# Patient Record
Sex: Male | Born: 2000 | Race: Black or African American | Hispanic: No | Marital: Single | Smoking: Never smoker
Health system: Southern US, Community
[De-identification: ages and names within clinical notes are randomized; demographics above are authoritative.]

## PROBLEM LIST (undated history)

## (undated) DIAGNOSIS — F913 Oppositional defiant disorder: Secondary | ICD-10-CM

## (undated) DIAGNOSIS — F988 Other specified behavioral and emotional disorders with onset usually occurring in childhood and adolescence: Secondary | ICD-10-CM

## (undated) DIAGNOSIS — G40909 Epilepsy, unspecified, not intractable, without status epilepticus: Secondary | ICD-10-CM

## (undated) DIAGNOSIS — T7840XA Allergy, unspecified, initial encounter: Secondary | ICD-10-CM

## (undated) DIAGNOSIS — K219 Gastro-esophageal reflux disease without esophagitis: Secondary | ICD-10-CM

## (undated) DIAGNOSIS — G43909 Migraine, unspecified, not intractable, without status migrainosus: Secondary | ICD-10-CM

## (undated) HISTORY — DX: Allergy, unspecified, initial encounter: T78.40XA

## (undated) HISTORY — PX: TYMPANOSTOMY TUBE PLACEMENT: SHX32

## (undated) HISTORY — DX: Other specified behavioral and emotional disorders with onset usually occurring in childhood and adolescence: F98.8

## (undated) HISTORY — DX: Epilepsy, unspecified, not intractable, without status epilepticus: G40.909

---
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
## 2020-01-17 DIAGNOSIS — H5213 Myopia, bilateral: Secondary | ICD-10-CM | POA: Diagnosis not present

## 2020-01-18 DIAGNOSIS — H5213 Myopia, bilateral: Secondary | ICD-10-CM | POA: Diagnosis not present

## 2020-03-28 DIAGNOSIS — H52223 Regular astigmatism, bilateral: Secondary | ICD-10-CM | POA: Diagnosis not present

## 2020-04-25 ENCOUNTER — Ambulatory Visit: Payer: Medicaid Other | Admitting: Family Medicine

## 2020-08-10 DIAGNOSIS — J342 Deviated nasal septum: Secondary | ICD-10-CM | POA: Diagnosis not present

## 2020-08-10 DIAGNOSIS — J3489 Other specified disorders of nose and nasal sinuses: Secondary | ICD-10-CM | POA: Diagnosis not present

## 2020-08-11 NOTE — Progress Notes (Deleted)
Name: Raymond Yang   MRN: [id number]    DOB: 2001-10-20   Date:08/11/2020       Progress Note  Subjective  Chief Complaint  Possible STD infection  HPI  Chronic cough: no previous history of asthma, he has been vaping for almost one year and has been smoking pot for a few years now. He developed a cough that was very wet initially, still present, has some wheezing, some SOB. No hemoptysis He is losing weight. Mother is not sure if any exposure to TB. Mother states she can hear him coughing at night. He had normal CXR 2 weeks ago, no anemia, he is on Ventolin and it does not seem to help with symptoms.   MDD and GAD/ Insomnia/ADD: he refuses to go back on ADD medication, he is willing to try Seroquel ( off label indication for sleep) and consider Prozac weekly, he states he will not take medication daily. Discussed possible side effects. Importance of seeing psychiatrist and or therapist, but he refuses it.

## 2020-08-14 ENCOUNTER — Ambulatory Visit: Payer: Medicaid Other | Admitting: Family Medicine

## 2021-03-28 ENCOUNTER — Other Ambulatory Visit: Payer: Self-pay

## 2021-03-28 ENCOUNTER — Ambulatory Visit (INDEPENDENT_AMBULATORY_CARE_PROVIDER_SITE_OTHER): Payer: Medicaid Other | Admitting: Family Medicine

## 2021-03-28 ENCOUNTER — Ambulatory Visit: Payer: Self-pay | Admitting: *Deleted

## 2021-03-28 ENCOUNTER — Encounter: Payer: Self-pay | Admitting: Family Medicine

## 2021-03-28 DIAGNOSIS — R109 Unspecified abdominal pain: Secondary | ICD-10-CM | POA: Diagnosis not present

## 2021-03-28 DIAGNOSIS — R197 Diarrhea, unspecified: Secondary | ICD-10-CM

## 2021-03-28 DIAGNOSIS — R1110 Vomiting, unspecified: Secondary | ICD-10-CM

## 2021-03-28 MED ORDER — ONDANSETRON 4 MG PO TBDP: 4.0000 mg | ORAL_TABLET | Freq: Three times a day (TID) | ORAL | 1 refills | Status: DC | PRN

## 2021-03-28 NOTE — Telephone Encounter (Signed)
Opened in error

## 2021-03-28 NOTE — Progress Notes (Signed)
Name: Raymond Yang   MRN: [id number]    DOB: [birth year]   Date:03/28/2021       Progress Note  Subjective:    Chief Complaint  Chief Complaint  Patient presents with  . Abdominal Pain    For 5 days, vomiting and diarrhea    I connected with  Ac Colan Murton  on 03/28/21 at  1:20 PM EDT by a video enabled telemedicine application and verified that I am speaking with the correct person using two identifiers.  I discussed the limitations of evaluation and management by telemedicine and the availability of in person appointments. The patient expressed understanding and agreed to proceed. Staff also discussed with the patient that there may be a patient responsible charge related to this service. Patient Location:  home Provider Location: [facility] clinic Additional Individuals present: none  Joined virtual encounter, pt not on, sent invite: Your invite to [phone] was sent. Please wait for a few seconds for the participant to join. This invitation will expire in 2 hours.  HPI Pt last OV 11/2018 Hx of marijuana use, vaping, anxiety/depression, wheezing, insomnia, GERD  Pt presents with "sick on his stomach" throwing up everything he eat, onset 4 d ago Still having N/V/D which wakes him from his sleep Trying to drink water or other clear fluids "throughout the day I'm chillin" - in response to me asking if he has been able to keep fluids down Diarrhea throughout the day, no solid stool since Sat 3-4 BM per day and 3x waking from sleep at least 3x a night - sx worse at night and first thing in the morning. Some abd pain periumbilical comes and goes Feels hot and sweating when very nauseous No fever  No GERD recently No sick contacts Smoke marijuana daily and vaping  Not sure about weight loss 150-160 Wt Readings from Last 5 Encounters:  12/08/18 150 lb 8 oz (68.3 kg) (56 %, Z= 0.15)*  11/23/18 153 lb 14.4 oz (69.8 kg) (61 %, Z= 0.29)*  09/30/18 161 lb (73 kg) (72 %, Z= 0.58)*   09/25/18 170 lb (77.1 kg) (81 %, Z= 0.87)*  03/06/18 158 lb 4.8 oz (71.8 kg) (73 %, Z= 0.61)*   * Growth percentiles are based on CDC (Boys, 2-20 Years) data.   BMI Readings from Last 5 Encounters:  12/08/18 20.41 kg/m (31 %, Z= -0.49)*  11/23/18 20.87 kg/m (38 %, Z= -0.30)*  09/30/18 21.84 kg/m (53 %, Z= 0.09)*  09/25/18 23.06 kg/m (68 %, Z= 0.47)*  03/06/18 22.16 kg/m (62 %, Z= 0.32)*   * Growth percentiles are based on CDC (Boys, 2-20 Years) data.      Patient Active Problem List   Diagnosis Date Noted  . Cough 02/03/2019  . Wheezing 02/03/2019  . Marijuana use 08/07/2017  . GERD (gastroesophageal reflux disease) 06/20/2017  . Frequent nosebleeds 03/26/2017  . ADD (attention deficit disorder) 05/26/2015  . Insomnia, persistent 05/26/2015  . Oppositional defiant disorder 05/26/2015  . Seasonal allergic rhinitis 05/26/2015  . History of seizure 05/26/2015    Social History   Tobacco Use  . Smoking status: Never Smoker  . Smokeless tobacco: Never Used  Substance Use Topics  . Alcohol use: No    Alcohol/week: 0.0 standard drinks     Current Outpatient Medications:  .  loratadine (CLARITIN) 10 MG tablet, Take 1 tablet (10 mg total) by mouth 2 (two) times daily., Disp: 60 tablet, Rfl: 2 .  albuterol (PROVENTIL HFA;VENTOLIN HFA) 108 (90 Base)  MCG/ACT inhaler, Inhale 2 puffs into the lungs every 6 (six) hours as needed for wheezing or shortness of breath. (Patient not taking: Reported on 03/28/2021), Disp: 1 Inhaler, Rfl: 2 .  budesonide-formoterol (SYMBICORT) 160-4.5 MCG/ACT inhaler, Inhale 2 puffs into the lungs 2 (two) times daily. (Patient not taking: Reported on 03/28/2021), Disp: 1 Inhaler, Rfl: 3 .  FLUoxetine (PROZAC WEEKLY) 90 MG DR capsule, Take 1 capsule (90 mg total) by mouth every 7 (seven) days. (Patient not taking: Reported on 03/28/2021), Disp: 4 capsule, Rfl: 0 .  fluticasone (FLONASE) 50 MCG/ACT nasal spray, Place 2 sprays into both nostrils daily.  (Patient not taking: Reported on 03/28/2021), Disp: 16 g, Rfl: 6 .  QUEtiapine (SEROQUEL) 25 MG tablet, Take 1 tablet (25 mg total) by mouth at bedtime. (Patient not taking: Reported on 03/28/2021), Disp: 30 tablet, Rfl: 0  No Known Allergies  I personally reviewed active problem list, medication list, allergies, family history, social history, health maintenance, notes from last encounter, lab results, imaging with the patient/caregiver today.   Review of Systems  Constitutional: Negative.   HENT: Negative.   Eyes: Negative.   Respiratory: Negative.   Cardiovascular: Negative.   Gastrointestinal: Negative.   Endocrine: Negative.   Genitourinary: Negative.   Musculoskeletal: Negative.   Skin: Negative.   Allergic/Immunologic: Negative.   Neurological: Negative.   Hematological: Negative.   Psychiatric/Behavioral: Negative.   All other systems reviewed and are negative.     Objective:   Virtual encounter, vitals limited, only able to obtain the following There were no vitals filed for this visit. There is no height or weight on file to calculate BMI. Nursing Note and Vital Signs reviewed.  Physical Exam Vitals and nursing note reviewed.  Constitutional:      General: He is not in acute distress.    Appearance: He is well-developed and normal weight. He is not ill-appearing, toxic-appearing or diaphoretic.  Pulmonary:     Effort: No respiratory distress.  Neurological:     Mental Status: He is alert.     PE limited by telephone encounter  No results found for this or any previous visit (from the past 72 hour(s)).  Assessment and Plan:     ICD-10-CM   1. Abdominal pain, vomiting, and diarrhea  R10.9 ondansetron (ZOFRAN ODT) 4 MG disintegrating tablet   R11.10    R19.7   advised clear fluids, advance diet gradually, encouraged to get in person appt for appropriate eval of abd pain, he is tolering some fluids, daily marijana smoker - not sure if he has a virus or  possibly some cannabinoid hyperemesis syndrome?  I offered to evaluate him same day or the next day but he declined.  ER precautions reviewed, follow-up in urgent care precautions were reviewed.    -Red flags and when to present for emergency care or RTC including fever >101.67F, chest pain, shortness of breath, new/worsening/un-resolving symptoms, reviewed with patient at time of visit. Follow up and care instructions discussed and provided in AVS. - I discussed the assessment and treatment plan with the patient. The patient was provided an opportunity to ask questions and all were answered. The patient agreed with the plan and demonstrated an understanding of the instructions.  I provided 22 minutes of non-face-to-face time during this encounter.  Danelle Berry, PA-C 03/28/21 1:34 PM

## 2021-08-01 ENCOUNTER — Ambulatory Visit: Payer: Self-pay

## 2021-08-01 NOTE — Telephone Encounter (Signed)
Spoke with patient giving him a virtual visit for his nausea but he said that it was not bad and he would just wait till his appt next week and discuss with the doctor if he is still having this problem.

## 2021-08-01 NOTE — Telephone Encounter (Signed)
Reason for Disposition  [1] MILD or MODERATE vomiting AND [2] present > 48 hours (2 days) (Exception: mild vomiting with associated diarrhea)  Answer Assessment - Initial Assessment Questions 1. VOMITING SEVERITY: "How many times have you vomited in the past 24 hours?"     - MILD:  1 - 2 times/day    - MODERATE: 3 - 5 times/day, decreased oral intake without significant weight loss or symptoms of dehydration    - SEVERE: 6 or more times/day, vomits everything or nearly everything, with significant weight loss, symptoms of dehydration      1-2 2. ONSET: "When did the vomiting begin?"      2 days 3. FLUIDS: "What fluids or food have you vomited up today?" "Have you been able to keep any fluids down?"     Keeping fluids down 4. ABDOMINAL PAIN: "Are your having any abdominal pain?" If yes : "How bad is it and what does it feel like?" (e.g., crampy, dull, intermittent, constant)      Mild 5. DIARRHEA: "Is there any diarrhea?" If Yes, ask: "How many times today?"      No 6. CONTACTS: "Is there anyone else in the family with the same symptoms?"      No 7. CAUSE: "What do you think is causing your vomiting?"     Maybe nerves 8. HYDRATION STATUS: "Any signs of dehydration?" (e.g., dry mouth [not only dry lips], too weak to stand) "When did you last urinate?"     No 9. OTHER SYMPTOMS: "Do you have any other symptoms?" (e.g., fever, headache, vertigo, vomiting blood or coffee grounds, recent head injury)     No 10. PREGNANCY: "Is there any chance you are pregnant?" "When was your last menstrual period?"       N/a  Protocols used: Vomiting-A-AH

## 2021-08-01 NOTE — Telephone Encounter (Signed)
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 "Maybe it's nerves." Will vomit 1-2 x day. No fever or diarrhea. Has an appointment Monday but states if a morning appointment opens up this week, he would like to be seen.

## 2021-08-06 ENCOUNTER — Other Ambulatory Visit (HOSPITAL_COMMUNITY)
Admission: RE | Admit: 2021-08-06 | Discharge: 2021-08-06 | Disposition: A | Discharge status: Home / Self Care | Payer: Medicaid Other | Source: Ambulatory Visit | Attending: Family Medicine | Admitting: Family Medicine

## 2021-08-06 ENCOUNTER — Ambulatory Visit (INDEPENDENT_AMBULATORY_CARE_PROVIDER_SITE_OTHER): Payer: Medicaid Other | Admitting: Family Medicine

## 2021-08-06 ENCOUNTER — Other Ambulatory Visit: Payer: Self-pay

## 2021-08-06 VITALS — BP 110/72 | HR 68 | Temp 98.2°F | Resp 18 | Ht 69.0 in | Wt 164.1 lb

## 2021-08-06 DIAGNOSIS — Z202 Contact with and (suspected) exposure to infections with a predominantly sexual mode of transmission: Secondary | ICD-10-CM | POA: Insufficient documentation

## 2021-08-06 MED ORDER — DOXYCYCLINE HYCLATE 100 MG PO TABS: 100.0000 mg | ORAL_TABLET | Freq: Two times a day (BID) | ORAL | 0 refills | Status: AC

## 2021-08-06 NOTE — Progress Notes (Signed)
   SUBJECTIVE:   CHIEF COMPLAINT / HPI:   STI check - partner with chlamydia.  - no symptoms - no rashes, ulcers, penile discharge, dysuria - no h/o STI. - sexually active with 1 male partner.  - does not wear condoms.   OBJECTIVE:   BP 110/72   Pulse 68   Temp 98.2 F (36.8 C) (Oral)   Resp 18   Ht 5\' 9"  (1.753 m)   Wt 164 lb 1.6 oz (74.4 kg)   SpO2 98%   BMI 24.23 kg/m   Gen: well appearing, in NAD Card: Reg rate Lungs: Comfortable WOB on RA Ext: WWP, no edema  ASSESSMENT/PLAN:   Exposure to STD Obtain urine cytology, HIV, RPR, Hep C. Treat empirically with doxycycline, advised abstinence until completion of antibiotics. Safe sex practices reviewed.    , DO

## 2021-08-06 NOTE — Assessment & Plan Note (Signed)
Obtain urine cytology, HIV, RPR, Hep C. Treat empirically with doxycycline, advised abstinence until completion of antibiotics. Safe sex practices reviewed.

## 2021-08-07 LAB — RPR: RPR Ser Ql: NONREACTIVE

## 2021-08-07 LAB — HEPATITIS C ANTIBODY
Hepatitis C Ab: NONREACTIVE
SIGNAL TO CUT-OFF: 0.01 (ref <1.00)

## 2021-08-07 LAB — URINE CYTOLOGY ANCILLARY ONLY
Chlamydia: POSITIVE — AB
Comment: NEGATIVE
Comment: NEGATIVE
Comment: NORMAL
Neisseria Gonorrhea: NEGATIVE
Trichomonas: NEGATIVE

## 2021-08-07 LAB — HIV ANTIBODY (ROUTINE TESTING W REFLEX): HIV 1&2 Ab, 4th Generation: NONREACTIVE

## 2021-08-13 ENCOUNTER — Other Ambulatory Visit (HOSPITAL_COMMUNITY)
Admission: RE | Admit: 2021-08-13 | Discharge: 2021-08-13 | Disposition: A | Discharge status: Home / Self Care | Payer: Medicaid Other | Source: Ambulatory Visit | Attending: Family Medicine | Admitting: Family Medicine

## 2021-08-13 ENCOUNTER — Ambulatory Visit: Payer: Medicaid Other | Admitting: Family Medicine

## 2021-08-13 ENCOUNTER — Other Ambulatory Visit: Payer: Self-pay

## 2021-08-13 ENCOUNTER — Ambulatory Visit (INDEPENDENT_AMBULATORY_CARE_PROVIDER_SITE_OTHER): Payer: Medicaid Other | Admitting: Family Medicine

## 2021-08-13 DIAGNOSIS — Z202 Contact with and (suspected) exposure to infections with a predominantly sexual mode of transmission: Secondary | ICD-10-CM | POA: Insufficient documentation

## 2021-08-13 NOTE — Progress Notes (Signed)
Patient on nurse schedule for TOC. Did not see provider. Disregard encounter.

## 2021-08-14 LAB — URINE CYTOLOGY ANCILLARY ONLY
Chlamydia: NEGATIVE
Comment: NEGATIVE
Comment: NEGATIVE
Comment: NORMAL
Neisseria Gonorrhea: NEGATIVE
Trichomonas: NEGATIVE

## 2022-11-13 ENCOUNTER — Ambulatory Visit (HOSPITAL_COMMUNITY)
Admission: AD | Admit: 2022-11-13 | Discharge: 2022-11-13 | Disposition: A | Discharge status: Home / Self Care | Payer: Medicaid Other | Attending: Psychiatry | Admitting: Psychiatry

## 2022-11-14 NOTE — Progress Notes (Signed)
Name: Raymond Yang   MRN: [id number]    DOB: [birth year]   Date:11/15/2022       Progress Note  Subjective  Chief Complaint  Anxiety  HPI  GAD/MDD: he has a long history of depression and anxiety, he took prozac in the past , he has a long history of ADD but no on medications for years. He has seen therapists in the past. Currently lives alone, states reading the bible and wants to go back to church. He is financially stable as an Personnel officer, he states anxiety is affecting his ability to go out to hang out with friends, he states he had to live work due to feeling overwhelmed and goes home and that is causing the fear that he will lose his job. Anxiety is also affecting his ability to sleep.  He would like to resume medication and wants to see psychiatrist   Skin rash: red and itchy on lower abdomen  AR: he would like to resume loratadine   IBS: he has multiple episodes of watery stools, associated with abdominal cramping and sometimes inability to finish his bowel movements. No fever or chills. It has been going on all his life. 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   Patient Active Problem List   Diagnosis Date Noted   Major depression, recurrent, chronic (HCC) 11/15/2022   Marijuana use 08/07/2017   GERD (gastroesophageal reflux disease) 06/20/2017   ADD (attention deficit disorder) 05/26/2015   Insomnia, persistent 05/26/2015   Oppositional defiant disorder 05/26/2015   Seasonal allergic rhinitis 05/26/2015   History of seizure 05/26/2015    Past Surgical History:  Procedure Laterality Date   ORIF WRIST FRACTURE Right 09/30/2018   Procedure: OPEN REDUCTION INTERNAL FIXATION (ORIF) RIGHT SCAPHOID FRACTURE;  Surgeon: Christena Flake, MD;  Location: [facility];  Service: Orthopedics;  Laterality: Right;  ZIMMER HERBERT SCREW SYSTEM  FLUROSCAN   TYMPANOSTOMY TUBE PLACEMENT     as infant    Family History  Problem  Relation Age of Onset   Migraines Mother    Anxiety disorder Mother    Obesity Mother    Sleep apnea Father    Hypertension Father    Diabetes Father    Heart disease Maternal Grandmother     Social History   Tobacco Use   Smoking status: Never   Smokeless tobacco: Never  Substance Use Topics   Alcohol use: No    Alcohol/week: 0.0 standard drinks of alcohol     Current Outpatient Medications:    escitalopram (LEXAPRO) 10 MG tablet, Take 1 tablet (10 mg total) by mouth daily., Disp: 30 tablet, Rfl: 0   loratadine (CLARITIN) 10 MG tablet, Take 1 tablet (10 mg total) by mouth daily., Disp: 90 tablet, Rfl: 1  No Known Allergies  I personally reviewed active problem list, medication list, allergies, family history, social history, health maintenance with the patient/caregiver today.   ROS  Ten systems reviewed and is negative except as mentioned in HPI   Objective  Vitals:   11/15/22 1134  BP: 112/72  Pulse: 70  Resp: 14  Temp: 98.4 F (36.9 C)  TempSrc: Oral  SpO2: 99%  Weight: 177 lb 3.2 oz (80.4 kg)  Height: 6' 0.5" (1.842 m)    Body mass index is 23.7 kg/m.  Physical Exam  Constitutional: Patient appears well-developed and well-nourished.  No distress.  HEENT: head atraumatic, normocephalic, pupils equal and reactive to light,  neck supple Cardiovascular: Normal rate, regular rhythm and normal heart sounds.  No murmur heard. No BLE edema. Pulmonary/Chest: Effort normal and breath sounds normal. No respiratory distress. Abdominal: Soft.  There is no tenderness. Skin: erythematous rash on buckle area  Psychiatric: Patient has a normal mood and affect. behavior is normal. Judgment and thought content normal.    PHQ2/9:    11/15/2022   11:36 AM 08/06/2021   11:01 AM 03/28/2021    1:12 PM 12/08/2018    9:32 AM 11/23/2018   10:32 AM  Depression screen PHQ 2/9  Decreased Interest 2 0 0 3 3  Down, Depressed, Hopeless 1 0 0 2 2  PHQ - 2 Score 3 0 0 5 5   Altered sleeping 0 0 1 3 3   Tired, decreased energy 2 0 1 2 1   Change in appetite 3 0 1 0 1  Feeling bad or failure about yourself  3 0 3 3 3   Trouble concentrating 3 0 3 3 3   Moving slowly or fidgety/restless 0 0 0 2 3  Suicidal thoughts 1 0 0 3 3  PHQ-9 Score 15 0 9 21 22   Difficult doing work/chores Very difficult Not difficult at all  Extremely dIfficult Extremely dIfficult    phq 9 is positive     11/15/2022   11:36 AM 08/06/2021   11:01 AM 03/28/2021    1:14 PM  GAD 7 : Generalized Anxiety Score  Nervous, Anxious, on Edge 3 0 1  Control/stop worrying 3 0 2  Worry too much - different things 3 0 2  Trouble relaxing 3 0 0  Restless 3 0 0  Easily annoyed or irritable 3 0 0  Afraid - awful might happen 2 0 0  Total GAD 7 Score 20 0 5  Anxiety Difficulty Very difficult Not difficult at all Somewhat difficult       Fall Risk:    11/15/2022   11:35 AM 08/06/2021   11:01 AM 03/28/2021    1:12 PM 12/08/2018    9:07 AM 06/07/2016    3:36 PM  Fall Risk   Falls in the past year? 0 0 0 1 No  Number falls in past yr:  0 0 1   Injury with Fall?  0 0 1   Risk for fall due to : No Fall Risks      Follow up Falls prevention discussed;Education provided;Falls evaluation completed Falls evaluation completed Falls evaluation completed        Functional Status Survey: Is the patient deaf or have difficulty hearing?: No Does the patient have difficulty seeing, even when wearing glasses/contacts?: No Does the patient have difficulty concentrating, remembering, or making decisions?: No Does the patient have difficulty walking or climbing stairs?: No Does the patient have difficulty dressing or bathing?: No Does the patient have difficulty doing errands alone such as visiting a doctor's office or shopping?: No    Assessment & Plan   1. Major depression, recurrent, chronic (HCC)  - escitalopram (LEXAPRO) 10 MG tablet; Take 1 tablet (10 mg total) by mouth daily.  Dispense: 30  tablet; Refill: 0 - Ambulatory referral to Psychiatry - CBC with Differential/Platelet - COMPLETE METABOLIC PANEL WITH GFR - TSH  2. Seasonal allergic rhinitis, unspecified trigger  - loratadine (CLARITIN) 10 MG tablet; Take 1 tablet (10 mg total) by mouth daily.  Dispense: 90 tablet; Refill: 1  3. GAD (generalized anxiety disorder)  - escitalopram (LEXAPRO) 10 MG tablet; Take 1 tablet (10 mg total)  by mouth daily.  Dispense: 30 tablet; Refill: 0 - Ambulatory referral to Psychiatry  4. Contact dermatitis due to nickel  Tuck in his t-shirt   5. Routine screening for STI (sexually transmitted infection)  - HIV Antibody (routine testing w rflx) - RPR - Cytology (oral, anal, urethral) ancillary only  6. Irritable bowel syndrome with diarrhea  - amitriptyline (ELAVIL) 25 MG tablet; Take 1 tablet (25 mg total) by mouth at bedtime.  Dispense: 30 tablet; Refill: 0

## 2022-11-15 ENCOUNTER — Encounter: Payer: Self-pay | Admitting: Family Medicine

## 2022-11-15 ENCOUNTER — Ambulatory Visit: Payer: Medicaid Other | Admitting: Family Medicine

## 2022-11-15 VITALS — BP 112/72 | HR 70 | Temp 98.4°F | Resp 14 | Ht 72.5 in | Wt 177.2 lb

## 2022-11-15 DIAGNOSIS — J302 Other seasonal allergic rhinitis: Secondary | ICD-10-CM | POA: Diagnosis not present

## 2022-11-15 DIAGNOSIS — K580 Irritable bowel syndrome with diarrhea: Secondary | ICD-10-CM | POA: Diagnosis not present

## 2022-11-15 DIAGNOSIS — L230 Allergic contact dermatitis due to metals: Secondary | ICD-10-CM | POA: Diagnosis not present

## 2022-11-15 DIAGNOSIS — Z113 Encounter for screening for infections with a predominantly sexual mode of transmission: Secondary | ICD-10-CM | POA: Diagnosis not present

## 2022-11-15 DIAGNOSIS — F411 Generalized anxiety disorder: Secondary | ICD-10-CM | POA: Diagnosis not present

## 2022-11-15 DIAGNOSIS — F339 Major depressive disorder, recurrent, unspecified: Secondary | ICD-10-CM | POA: Diagnosis not present

## 2022-11-15 MED ORDER — ESCITALOPRAM OXALATE 10 MG PO TABS: 10.0000 mg | ORAL_TABLET | Freq: Every day | ORAL | 0 refills | Status: DC

## 2022-11-15 MED ORDER — AMITRIPTYLINE HCL 25 MG PO TABS: 25.0000 mg | ORAL_TABLET | Freq: Every day | ORAL | 0 refills | Status: DC

## 2022-11-15 MED ORDER — LORATADINE 10 MG PO TABS: 10.0000 mg | ORAL_TABLET | Freq: Every day | ORAL | 1 refills | Status: DC

## 2022-11-15 NOTE — Addendum Note (Signed)
Addended by: Dollene Primrose on: 11/15/2022 12:10 PM   Modules accepted: Orders

## 2022-11-17 LAB — CBC WITH DIFFERENTIAL/PLATELET
Absolute Monocytes: 540 cells/uL (ref 200–950)
Basophils Absolute: 50 cells/uL (ref 0–200)
Basophils Relative: 0.7 %
Eosinophils Absolute: 79 cells/uL (ref 15–500)
Eosinophils Relative: 1.1 %
HCT: 47 % (ref 38.5–50.0)
Hemoglobin: 16.1 g/dL (ref 13.2–17.1)
Lymphs Abs: 1411 cells/uL (ref 850–3900)
MCH: 29 pg (ref 27.0–33.0)
MCHC: 34.3 g/dL (ref 32.0–36.0)
MCV: 84.5 fL (ref 80.0–100.0)
MPV: 11.3 fL (ref 7.5–12.5)
Monocytes Relative: 7.5 %
Neutro Abs: 5119 cells/uL (ref 1500–7800)
Neutrophils Relative %: 71.1 %
Platelets: 150 10*3/uL (ref 140–400)
RBC: 5.56 10*6/uL (ref 4.20–5.80)
RDW: 12.6 % (ref 11.0–15.0)
Total Lymphocyte: 19.6 %
WBC: 7.2 10*3/uL (ref 3.8–10.8)

## 2022-11-17 LAB — COMPLETE METABOLIC PANEL WITH GFR
AG Ratio: 1.8 (calc) (ref 1.0–2.5)
ALT: 10 U/L (ref 9–46)
AST: 12 U/L (ref 10–40)
Albumin: 4.8 g/dL (ref 3.6–5.1)
Alkaline phosphatase (APISO): 75 U/L (ref 36–130)
BUN: 16 mg/dL (ref 7–25)
CO2: 24 mmol/L (ref 20–32)
Calcium: 9.4 mg/dL (ref 8.6–10.3)
Chloride: 105 mmol/L (ref 98–110)
Creat: 0.82 mg/dL (ref 0.60–1.24)
Globulin: 2.6 g/dL (calc) (ref 1.9–3.7)
Glucose, Bld: 82 mg/dL (ref 65–99)
Potassium: 3.8 mmol/L (ref 3.5–5.3)
Sodium: 141 mmol/L (ref 135–146)
Total Bilirubin: 1.4 mg/dL — ABNORMAL HIGH (ref 0.2–1.2)
Total Protein: 7.4 g/dL (ref 6.1–8.1)
eGFR: 128 mL/min/{1.73_m2} (ref >=60)

## 2022-11-17 LAB — HIV ANTIBODY (ROUTINE TESTING W REFLEX): HIV 1&2 Ab, 4th Generation: NONREACTIVE

## 2022-11-17 LAB — RPR: RPR Ser Ql: NONREACTIVE

## 2022-11-17 LAB — TSH: TSH: 0.96 mIU/L (ref 0.40–4.50)

## 2022-11-27 ENCOUNTER — Ambulatory Visit: Payer: Self-pay | Admitting: *Deleted

## 2022-11-27 NOTE — Telephone Encounter (Signed)
Reason for Disposition  [1] MILD difficulty breathing (e.g., minimal/no SOB at rest, SOB with walking, pulse <100) AND [2] NEW-onset or WORSE than normal  Answer Assessment - Initial Assessment Questions 1. RESPIRATORY STATUS: "Describe your breathing?" (e.g., wheezing, shortness of breath, unable to speak, severe coughing)      SOB with exertion, cough 2. ONSET: "When did this breathing problem begin?"      Saturday 3. PATTERN "Does the difficult breathing come and go, or has it been constant since it started?"      Constant- congested 4. SEVERITY: "How bad is your breathing?" (e.g., mild, moderate, severe)    - MILD: No SOB at rest, mild SOB with walking, speaks normally in sentences, can lie down, no retractions, pulse < 100.    - MODERATE: SOB at rest, SOB with minimal exertion and prefers to sit, cannot lie down flat, speaks in phrases, mild retractions, audible wheezing, pulse 100-120.    - SEVERE: Very SOB at rest, speaks in single words, struggling to breathe, sitting hunched forward, retractions, pulse > 120      mild 5. RECURRENT SYMPTOM: "Have you had difficulty breathing before?" If Yes, ask: "When was the last time?" and "What happened that time?"      yes 6. CARDIAC HISTORY: "Do you have any history of heart disease?" (e.g., heart attack, angina, bypass surgery, angioplasty)      no 7. LUNG HISTORY: "Do you have any history of lung disease?"  (e.g., pulmonary embolus, asthma, emphysema)     no 8. CAUSE: "What do you think is causing the breathing problem?"      unsure 9. OTHER SYMPTOMS: "Do you have any other symptoms? (e.g., dizziness, runny nose, cough, chest pain, fever)     Cough, congestion, runny nose  Protocols used: Breathing Difficulty-A-AH

## 2022-11-27 NOTE — Telephone Encounter (Signed)
  Chief Complaint: mild SOB-cough Symptoms: cough, congestion, SOB with exertion  Frequency: started Saturday Pertinent Negatives: Patient denies fever Disposition: [] ED /[] Urgent Care (no appt availability in office) / [x] Appointment(In office/virtual)/ []  [facility]/ [] Home Care/ [] Refused Recommended Disposition /[] [facility]/ []  Follow-up with PCP Additional Notes: Patient states he can not come to office today- he is working out of town- appointment schedule in am- advised if gets worse before that- be seen immediately

## 2022-11-27 NOTE — Progress Notes (Signed)
Name: Raymond Yang   MRN: [id number]    DOB: 2001/11/08   Date:11/28/2022       Progress Note  Subjective  Chief Complaint  Cough  HPI  Symptoms started 5 days ago, he noticed rhinorrhea, sneezing and cough . He states cough is productive now, he also has some sob with minimal activity . He is waking up with nose bleeds. He has noticed lack of appetite and chills. No fever. 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 He has a history of pneumonia   MDD: he states medication seems to be helping, Elavil has also improved his abdominal pain and diarrhea.    Patient Active Problem List   Diagnosis Date Noted   Major depression, recurrent, chronic (HCC) 11/15/2022   Marijuana use 08/07/2017   GERD (gastroesophageal reflux disease) 06/20/2017   ADD (attention deficit disorder) 05/26/2015   Insomnia, persistent 05/26/2015   Oppositional defiant disorder 05/26/2015   Seasonal allergic rhinitis 05/26/2015   History of seizure 05/26/2015    Past Surgical History:  Procedure Laterality Date   ORIF WRIST FRACTURE Right 09/30/2018   Procedure: OPEN REDUCTION INTERNAL FIXATION (ORIF) RIGHT SCAPHOID FRACTURE;  Surgeon: Christena Flake, MD;  Location: [facility];  Service: Orthopedics;  Laterality: Right;  ZIMMER HERBERT SCREW SYSTEM  FLUROSCAN   TYMPANOSTOMY TUBE PLACEMENT     as infant    Family History  Problem Relation Age of Onset   Migraines Mother    Anxiety disorder Mother    Obesity Mother    Sleep apnea Father    Hypertension Father    Diabetes Father    Heart disease Maternal Grandmother     Social History   Tobacco Use   Smoking status: Never   Smokeless tobacco: Never  Substance Use Topics   Alcohol use: No    Alcohol/week: 0.0 standard drinks of alcohol     Current Outpatient Medications:    amitriptyline (ELAVIL) 25 MG tablet, Take 1 tablet (25 mg total) by mouth at bedtime., Disp:  30 tablet, Rfl: 0   escitalopram (LEXAPRO) 10 MG tablet, Take 1 tablet (10 mg total) by mouth daily., Disp: 30 tablet, Rfl: 0   loratadine (CLARITIN) 10 MG tablet, Take 1 tablet (10 mg total) by mouth daily., Disp: 90 tablet, Rfl: 1  No Known Allergies  I personally reviewed active problem list, medication list, allergies, family history, social history, health maintenance with the patient/caregiver today.   ROS  Ten systems reviewed and is negative except as mentioned in HPI   Objective  Vitals:   11/28/22 0814  BP: 124/80  Pulse: 95  Resp: 18  Temp: 98.5 F (36.9 C)  TempSrc: Oral  SpO2: 94%  Weight: 176 lb 11.2 oz (80.2 kg)  Height: 6' 2.5" (1.892 m)    Body mass index is 22.38 kg/m.  Physical Exam  Constitutional: Patient appears well-developed and well-nourished.  No distress.  HEENT: head atraumatic, normocephalic, pupils equal and reactive to light, ears normal TM, neck supple, throat within normal limits Cardiovascular: Normal rate, regular rhythm and normal heart sounds.  No murmur heard. No BLE edema. Pulmonary/Chest: Effort normal , crackles on left lower lung field  No respiratory distress. Abdominal: Soft.  There is no tenderness. Psychiatric: Patient has a normal mood and affect. behavior is normal. Judgment and thought content normal.   Recent Results (from the past 2160 hour(s))  CBC with  Differential/Platelet     Status: None   Collection Time: 11/15/22 12:14 PM  Result Value Ref Range   WBC 7.2 3.8 - 10.8 Thousand/uL   RBC 5.56 4.20 - 5.80 Million/uL   Hemoglobin 16.1 13.2 - 17.1 g/dL   HCT 16.1 09.6 - 04.5 %   MCV 84.5 80.0 - 100.0 fL   MCH 29.0 27.0 - 33.0 pg   MCHC 34.3 32.0 - 36.0 g/dL    Comment: Specimen was prewarmed to 37 degrees to obtain results. Cold agglutinin/cryoglobulin suspected.    RDW 12.6 11.0 - 15.0 %   Platelets 150 140 - 400 Thousand/uL   MPV 11.3 7.5 - 12.5 fL   Neutro Abs 5,119 1,500 - 7,800 cells/uL   Lymphs Abs  1,411 850 - 3,900 cells/uL   Absolute Monocytes 540 200 - 950 cells/uL   Eosinophils Absolute 79 15 - 500 cells/uL   Basophils Absolute 50 0 - 200 cells/uL   Neutrophils Relative % 71.1 %   Total Lymphocyte 19.6 %   Monocytes Relative 7.5 %   Eosinophils Relative 1.1 %   Basophils Relative 0.7 %  COMPLETE METABOLIC PANEL WITH GFR     Status: Abnormal   Collection Time: 11/15/22 12:14 PM  Result Value Ref Range   Glucose, Bld 82 65 - 99 mg/dL    Comment: .            Fasting reference interval .    BUN 16 7 - 25 mg/dL   Creat 4.09 8.11 - 9.14 mg/dL   eGFR 782 > OR = 60 NF/AOZ/3.08M5   BUN/Creatinine Ratio SEE NOTE: 6 - 22 (calc)    Comment:    Not Reported: BUN and Creatinine are within    reference range. .    Sodium 141 135 - 146 mmol/L   Potassium 3.8 3.5 - 5.3 mmol/L   Chloride 105 98 - 110 mmol/L   CO2 24 20 - 32 mmol/L   Calcium 9.4 8.6 - 10.3 mg/dL   Total Protein 7.4 6.1 - 8.1 g/dL   Albumin 4.8 3.6 - 5.1 g/dL   Globulin 2.6 1.9 - 3.7 g/dL (calc)   AG Ratio 1.8 1.0 - 2.5 (calc)   Total Bilirubin 1.4 (H) 0.2 - 1.2 mg/dL   Alkaline phosphatase (APISO) 75 36 - 130 U/L   AST 12 10 - 40 U/L   ALT 10 9 - 46 U/L  HIV Antibody (routine testing w rflx)     Status: None   Collection Time: 11/15/22 12:14 PM  Result Value Ref Range   HIV 1&2 Ab, 4th Generation NON-REACTIVE NON-REACTIVE    Comment: HIV-1 antigen and HIV-1/HIV-2 antibodies were not detected. There is no laboratory evidence of HIV infection. Marland Kitchen PLEASE NOTE: This information has been disclosed to you from records whose confidentiality may be protected by state law.  If your state requires such protection, then the state law prohibits you from making any further disclosure of the information without the specific written consent of the person to whom it pertains, or as otherwise permitted by law. A general authorization for the release of medical or other information is NOT sufficient for this  purpose. . For additional information please refer to http://education.[facility].com/faq/FAQ106 (This link is being provided for informational/ educational purposes only.) . Marland Kitchen The performance of this assay has not been clinically validated in patients less than 37 years old. .   TSH     Status: None   Collection Time: 11/15/22 12:14 PM  Result Value Ref Range   TSH 0.96 0.40 - 4.50 mIU/L  RPR     Status: None   Collection Time: 11/15/22 12:14 PM  Result Value Ref Range   RPR Ser Ql NON-REACTIVE NON-REACTIVE    PHQ2/9:    11/28/2022    8:16 AM 11/15/2022   11:36 AM 08/06/2021   11:01 AM 03/28/2021    1:12 PM 12/08/2018    9:32 AM  Depression screen PHQ 2/9  Decreased Interest 2 2 0 0 3  Down, Depressed, Hopeless 1 1 0 0 2  PHQ - 2 Score 3 3 0 0 5  Altered sleeping 0 0 0 1 3  Tired, decreased energy 2 2 0 1 2  Change in appetite 3 3 0 1 0  Feeling bad or failure about yourself  3 3 0 3 3  Trouble concentrating 3 3 0 3 3  Moving slowly or fidgety/restless 0 0 0 0 2  Suicidal thoughts 1 1 0 0 3  PHQ-9 Score 15 15 0 9 21  Difficult doing work/chores Very difficult Very difficult Not difficult at all  Extremely dIfficult    phq 9 is positive   Fall Risk:    11/28/2022    8:16 AM 11/15/2022   11:35 AM 08/06/2021   11:01 AM 03/28/2021    1:12 PM 12/08/2018    9:07 AM  Fall Risk   Falls in the past year? 0 0 0 0 1  Number falls in past yr:   0 0 1  Injury with Fall?   0 0 1  Risk for fall due to : No Fall Risks No Fall Risks     Follow up Falls prevention discussed;Education provided;Falls evaluation completed Falls prevention discussed;Education provided;Falls evaluation completed Falls evaluation completed Falls evaluation completed       Functional Status Survey: Is the patient deaf or have difficulty hearing?: No Does the patient have difficulty seeing, even when wearing glasses/contacts?: No Does the patient have difficulty concentrating, remembering,  or making decisions?: No Does the patient have difficulty walking or climbing stairs?: No Does the patient have difficulty dressing or bathing?: No Does the patient have difficulty doing errands alone such as visiting a doctor's office or shopping?: No    Assessment & Plan 1. Abnormal lung sounds  - azithromycin (ZITHROMAX) 250 MG tablet; Take 2 tablets on day 1, then 1 tablet daily on days 2 through 5  Dispense: 6 tablet; Refill: 0 - cefTRIAXone (ROCEPHIN) injection 500 mg - cefTRIAXone (ROCEPHIN) injection 500 mg - DG Chest 2 View; Future  Discussed rest, hydration, return next week if no improvement of symptoms, may take mucinex otc   Likely CAP  2. Productive cough  - azithromycin (ZITHROMAX) 250 MG tablet; Take 2 tablets on day 1, then 1 tablet daily on days 2 through 5  Dispense: 6 tablet; Refill: 0 - cefTRIAXone (ROCEPHIN) injection 500 mg - cefTRIAXone (ROCEPHIN) injection 500 mg - DG Chest 2 View; Future  3. SOB (shortness of breath)  - azithromycin (ZITHROMAX) 250 MG tablet; Take 2 tablets on day 1, then 1 tablet daily on days 2 through 5  Dispense: 6 tablet; Refill: 0 - cefTRIAXone (ROCEPHIN) injection 500 mg - cefTRIAXone (ROCEPHIN) injection 500 mg - DG Chest 2 View; Future

## 2022-11-28 ENCOUNTER — Encounter: Payer: Self-pay | Admitting: Family Medicine

## 2022-11-28 ENCOUNTER — Ambulatory Visit: Payer: Medicaid Other | Admitting: Family Medicine

## 2022-11-28 VITALS — BP 124/80 | HR 95 | Temp 98.5°F | Resp 18 | Ht 74.5 in | Wt 176.7 lb

## 2022-11-28 DIAGNOSIS — R0602 Shortness of breath: Secondary | ICD-10-CM | POA: Diagnosis not present

## 2022-11-28 DIAGNOSIS — R058 Other specified cough: Secondary | ICD-10-CM | POA: Diagnosis not present

## 2022-11-28 DIAGNOSIS — R0989 Other specified symptoms and signs involving the circulatory and respiratory systems: Secondary | ICD-10-CM | POA: Diagnosis not present

## 2022-11-28 MED ORDER — AZITHROMYCIN 250 MG PO TABS: ORAL_TABLET | ORAL | 0 refills | Status: AC

## 2022-11-28 MED ORDER — CEFTRIAXONE SODIUM 500 MG IJ SOLR
500.0000 mg | Freq: Once | INTRAMUSCULAR | Status: AC
  Administered 2022-11-28: 500 mg via INTRAMUSCULAR

## 2022-12-12 ENCOUNTER — Other Ambulatory Visit: Payer: Self-pay | Admitting: Family Medicine

## 2022-12-12 DIAGNOSIS — K580 Irritable bowel syndrome with diarrhea: Secondary | ICD-10-CM

## 2022-12-12 DIAGNOSIS — F411 Generalized anxiety disorder: Secondary | ICD-10-CM

## 2022-12-12 DIAGNOSIS — F339 Major depressive disorder, recurrent, unspecified: Secondary | ICD-10-CM

## 2022-12-19 NOTE — Progress Notes (Signed)
Name: Raymond Yang   MRN: [id number]    DOB: 08/16/2001   Date:12/20/2022       Progress Note  Subjective  Chief Complaint  Follow Up  HPI  GAD/MDD: he has a long history of depression and anxiety, he took prozac in the past , he has a long history of ADD but no on medications for years. He has seen therapists in the past. Currently lives alone, states reading the bible , he states planning on going to church this weekend with a friend. .When he came in to be seen in Dec he told me his anxiety was  affecting his ability to go out to hang out with friends, he states he had to live work due to feeling overwhelmed and goes home and that is causing the fear that he will lose his job. Anxiety is also affecting his ability to sleep. We gave him Lexapro and he states it has helped with anxiety but he is still very depressed, we will add Vraylar to see if it will help with his mood    IBS: he has multiple episodes of watery stools, associated with abdominal cramping and sometimes inability to finish his bowel movements. No fever or chills. It has been going on all his life.We gave him Elavil and states frequency of bowel movements decreased from 4-5 or more bowel movements during working hours but now only having it a couple of times in the 10 hour shift. Cramping is intermittent. Feeling a little bloated for the past few days. He states likely from the holidays - he has been drinking more    Patient Active Problem List   Diagnosis Date Noted   Major depression, recurrent, chronic ([place]) 11/15/2022   Marijuana use 08/07/2017   GERD (gastroesophageal reflux disease) 06/20/2017   ADD (attention deficit disorder) 05/26/2015   Insomnia, persistent 05/26/2015   Oppositional defiant disorder 05/26/2015   Seasonal allergic rhinitis 05/26/2015   History of seizure 05/26/2015    Past Surgical History:  Procedure Laterality Date   ORIF WRIST FRACTURE Right 09/30/2018   Procedure: OPEN REDUCTION INTERNAL  FIXATION (ORIF) RIGHT SCAPHOID FRACTURE;  Surgeon: Corky Mull, MD;  Location: [facility];  Service: Orthopedics;  Laterality: Right;  [place]     as infant    Family History  Problem Relation Age of Onset   Migraines Mother    Anxiety disorder Mother    Obesity Mother    Sleep apnea Father    Hypertension Father    Diabetes Father    Heart disease Maternal Grandmother     Social History   Tobacco Use   Smoking status: Never   Smokeless tobacco: Never  Substance Use Topics   Alcohol use: No    Alcohol/week: 0.0 standard drinks of alcohol     Current Outpatient Medications:    amitriptyline (ELAVIL) 25 MG tablet, TAKE 1 TABLET(25 MG) BY MOUTH AT BEDTIME, Disp: 30 tablet, Rfl: 0   escitalopram (LEXAPRO) 10 MG tablet, TAKE 1 TABLET(10 MG) BY MOUTH DAILY, Disp: 30 tablet, Rfl: 0   loratadine (CLARITIN) 10 MG tablet, Take 1 tablet (10 mg total) by mouth daily., Disp: 90 tablet, Rfl: 1  No Known Allergies  I personally reviewed active problem list, medication list, allergies, family history, social history, health maintenance with the patient/caregiver today.   ROS  Ten systems reviewed and is negative except as mentioned in HPI   Objective  Vitals:   12/20/22 1130  BP: 116/74  Pulse: 86  Resp: 16  SpO2: 99%  Weight: 183 lb (83 kg)  Height: _0  (1.88 m)    Body mass index is 23.5 kg/m.  Physical Exam  Constitutional: Patient appears well-developed and well-nourished.  No distress.  HEENT: head atraumatic, normocephalic, pupils equal and reactive to light, neck supple Cardiovascular: Normal rate, regular rhythm and normal heart sounds.  No murmur heard. No BLE edema. Pulmonary/Chest: Effort normal and breath sounds normal. No respiratory distress. Abdominal: Soft.  There is no tenderness. Psychiatric: Patient has a normal mood and affect. behavior is normal. Judgment and thought content  normal.   Recent Results (from the past 2160 hour(s))  CBC with Differential/Platelet     Status: None   Collection Time: 11/15/22 12:14 PM  Result Value Ref Range   WBC 7.2 3.8 - 10.8 Thousand/uL   RBC 5.56 4.20 - 5.80 Million/uL   Hemoglobin 16.1 13.2 - 17.1 g/dL   HCT 47.0 38.5 - 50.0 %   MCV 84.5 80.0 - 100.0 fL   MCH 29.0 27.0 - 33.0 pg   MCHC 34.3 32.0 - 36.0 g/dL    Comment: Specimen was prewarmed to 37 degrees to obtain results. Cold agglutinin/cryoglobulin suspected.    RDW 12.6 11.0 - 15.0 %   Platelets 150 140 - 400 Thousand/uL   MPV 11.3 7.5 - 12.5 fL   Neutro Abs 5,119 1,500 - 7,800 cells/uL   Lymphs Abs 1,411 850 - 3,900 cells/uL   Absolute Monocytes 540 200 - 950 cells/uL   Eosinophils Absolute 79 15 - 500 cells/uL   Basophils Absolute 50 0 - 200 cells/uL   Neutrophils Relative % 71.1 %   Total Lymphocyte 19.6 %   Monocytes Relative 7.5 %   Eosinophils Relative 1.1 %   Basophils Relative 0.7 %  COMPLETE METABOLIC PANEL WITH GFR     Status: Abnormal   Collection Time: 11/15/22 12:14 PM  Result Value Ref Range   Glucose, Bld 82 65 - 99 mg/dL    Comment: .            Fasting reference interval .    BUN 16 7 - 25 mg/dL   Creat 0.82 0.60 - 1.24 mg/dL   eGFR 128 > OR = 60 mL/min/1.63m   BUN/Creatinine Ratio SEE NOTE: 6 - 22 (calc)    Comment:    Not Reported: BUN and Creatinine are within    reference range. .    Sodium 141 135 - 146 mmol/L   Potassium 3.8 3.5 - 5.3 mmol/L   Chloride 105 98 - 110 mmol/L   CO2 24 20 - 32 mmol/L   Calcium 9.4 8.6 - 10.3 mg/dL   Total Protein 7.4 6.1 - 8.1 g/dL   Albumin 4.8 3.6 - 5.1 g/dL   Globulin 2.6 1.9 - 3.7 g/dL (calc)   AG Ratio 1.8 1.0 - 2.5 (calc)   Total Bilirubin 1.4 (H) 0.2 - 1.2 mg/dL   Alkaline phosphatase (APISO) 75 36 - 130 U/L   AST 12 10 - 40 U/L   ALT 10 9 - 46 U/L  HIV Antibody (routine testing w rflx)     Status: None   Collection Time: 11/15/22 12:14 PM  Result Value Ref Range   HIV 1&2 Ab,  4th Generation NON-REACTIVE NON-REACTIVE    Comment: HIV-1 antigen and HIV-1/HIV-2 antibodies were not detected. There is no laboratory evidence of HIV infection. .Marland KitchenPLEASE NOTE: This information has  been disclosed to you from records whose confidentiality may be protected by state law.  If your state requires such protection, then the state law prohibits you from making any further disclosure of the information without the specific written consent of the person to whom it pertains, or as otherwise permitted by law. A general authorization for the release of medical or other information is NOT sufficient for this purpose. . For additional information please refer to http://education.questdiagn[employer].com/faq/FAQ106 (This link is being provided for informational/ educational purposes only.) . Marland Kitchen The performance of this assay has not been clinically validated in patients less than 17 years old. .   TSH     Status: None   Collection Time: 11/15/22 12:14 PM  Result Value Ref Range   TSH 0.96 0.40 - 4.50 mIU/L  RPR     Status: None   Collection Time: 11/15/22 12:14 PM  Result Value Ref Range   RPR Ser Ql NON-REACTIVE NON-REACTIVE    PHQ2/9:    12/20/2022   11:34 AM 11/28/2022    8:16 AM 11/15/2022   11:36 AM 08/06/2021   11:01 AM 03/28/2021    1:12 PM  Depression screen PHQ 2/9  Decreased Interest _0 0 0  Down, Depressed, Hopeless _1 0 0  PHQ - 2 Score _2 0 0  Altered sleeping 3 0 0 0 1  Tired, decreased energy _3 0 1  Change in appetite 0 3 3 0 1  Feeling bad or failure about yourself  _4 0 3  Trouble concentrating _5 0 3  Moving slowly or fidgety/restless 0 0 0 0 0  Suicidal thoughts 0 1 1 0 0  PHQ-9 Score _6 0 9  Difficult doing work/chores  Very difficult Very difficult Not difficult at all     phq 9 is positive     12/20/2022   11:36 AM 11/28/2022    8:17 AM 11/15/2022   11:36 AM 08/06/2021   11:01 AM  GAD 7 : Generalized Anxiety Score   Nervous, Anxious, on Edge _7 0  Control/stop worrying _8 0  Worry too much - different things _9 0  Trouble relaxing _10 0  Restless _11 0  Easily annoyed or irritable _12 0  Afraid - awful might happen _13 0  Total GAD 7 Score _14 0  Anxiety Difficulty  Very difficult Very difficult Not difficult at all     Fall Risk:    12/20/2022   11:29 AM 11/28/2022    8:16 AM 11/15/2022   11:35 AM 08/06/2021   11:01 AM 03/28/2021    1:12 PM  Fall Risk   Falls in the past year? 0 0 0 0 0  Number falls in past yr: 0   0 0  Injury with Fall? 0   0 0  Risk for fall due to : No Fall Risks No Fall Risks No Fall Risks    Follow up Falls prevention discussed Falls prevention discussed;Education provided;Falls evaluation completed Falls prevention discussed;Education provided;Falls evaluation completed Falls evaluation completed Falls evaluation completed      Functional Status Survey: Is the patient deaf or have difficulty hearing?: No Does the patient have difficulty seeing, even when wearing glasses/contacts?: No Does the patient have difficulty concentrating, remembering, or making decisions?: No Does the patient have difficulty walking or climbing stairs?: No Does  the patient have difficulty dressing or bathing?: No Does the patient have difficulty doing errands alone such as visiting a doctor's office or shopping?: No    Assessment & Plan  1. Major depression, recurrent, chronic (H[facility])  - cariprazine (VRAYLAR) 1.5 MG capsule; Take 1 capsule (1.5 mg total) by mouth daily.  Dispense: 30 capsule; Refill: 0  Explained cannot drink a lot of alcohol while taking this medication  2. GAD (generalized anxiety disorder)   3. Irritable bowel syndrome with diarrhea  Doing better

## 2022-12-20 ENCOUNTER — Other Ambulatory Visit: Payer: Self-pay | Admitting: Family Medicine

## 2022-12-20 ENCOUNTER — Encounter: Payer: Self-pay | Admitting: Family Medicine

## 2022-12-20 ENCOUNTER — Ambulatory Visit (INDEPENDENT_AMBULATORY_CARE_PROVIDER_SITE_OTHER): Payer: Medicaid Other | Admitting: Family Medicine

## 2022-12-20 VITALS — BP 116/74 | HR 86 | Resp 16 | Ht 74.0 in | Wt 183.0 lb

## 2022-12-20 DIAGNOSIS — F339 Major depressive disorder, recurrent, unspecified: Secondary | ICD-10-CM | POA: Diagnosis not present

## 2022-12-20 DIAGNOSIS — K580 Irritable bowel syndrome with diarrhea: Secondary | ICD-10-CM

## 2022-12-20 DIAGNOSIS — F411 Generalized anxiety disorder: Secondary | ICD-10-CM

## 2022-12-20 MED ORDER — CARIPRAZINE HCL 1.5 MG PO CAPS: 1.5000 mg | ORAL_CAPSULE | Freq: Every day | ORAL | 0 refills | Status: DC

## 2022-12-23 ENCOUNTER — Telehealth: Payer: Self-pay | Admitting: Family Medicine

## 2022-12-23 MED ORDER — CARIPRAZINE HCL 1.5 MG PO CAPS: 1.5000 mg | ORAL_CAPSULE | Freq: Every day | ORAL | 0 refills | Status: DC

## 2022-12-23 NOTE — Telephone Encounter (Signed)
Patient called in needs PA for cariprazine (VRAYLAR) 1.5 MG capsule .

## 2022-12-23 NOTE — Telephone Encounter (Signed)
PA was approved from 12/20/22-12/21/23

## 2023-01-09 NOTE — Progress Notes (Deleted)
Name: Raymond Yang   MRN: [id number]    DOB: 2001/06/13   Date:01/09/2023       Progress Note  Subjective  Chief Complaint  Follow Up  HPI  GAD/MDD: he has a long history of depression and anxiety, he took prozac in the past , he has a long history of ADD but no on medications for years. He has seen therapists in the past. Currently lives alone, states reading the bible , he states planning on going to church this weekend with a friend. .When he came in to be seen in Dec he told me his anxiety was  affecting his ability to go out to hang out with friends, he states he had to live work due to feeling overwhelmed and goes home and that is causing the fear that he will lose his job. Anxiety is also affecting his ability to sleep. We gave him Lexapro and he states it has helped with anxiety but he is still very depressed, we will add Vraylar to see if it will help with his mood    IBS: he has multiple episodes of watery stools, associated with abdominal cramping and sometimes inability to finish his bowel movements. No fever or chills. It has been going on all his life.We gave him Elavil and states frequency of bowel movements decreased from 4-5 or more bowel movements during working hours but now only having it a couple of times in the 10 hour shift. Cramping is intermittent. Feeling a little bloated for the past few days. He states likely from the holidays - he has been drinking more   Patient Active Problem List   Diagnosis Date Noted   Irritable bowel syndrome with diarrhea 12/20/2022   GAD (generalized anxiety disorder) 12/20/2022   Major depression, recurrent, chronic ([place]) 11/15/2022   Marijuana use 08/07/2017   GERD (gastroesophageal reflux disease) 06/20/2017   ADD (attention deficit disorder) 05/26/2015   Insomnia, persistent 05/26/2015   Oppositional defiant disorder 05/26/2015   Seasonal allergic rhinitis 05/26/2015   History of seizure 05/26/2015    Past Surgical History:   Procedure Laterality Date   ORIF WRIST FRACTURE Right 09/30/2018   Procedure: OPEN REDUCTION INTERNAL FIXATION (ORIF) RIGHT SCAPHOID FRACTURE;  Surgeon: Corky Mull, MD;  Location: [facility];  Service: Orthopedics;  Laterality: Right;  [place]     as infant    Family History  Problem Relation Age of Onset   Migraines Mother    Anxiety disorder Mother    Obesity Mother    Sleep apnea Father    Hypertension Father    Diabetes Father    Heart disease Maternal Grandmother     Social History   Tobacco Use   Smoking status: Never   Smokeless tobacco: Never  Substance Use Topics   Alcohol use: No    Alcohol/week: 0.0 standard drinks of alcohol     Current Outpatient Medications:    amitriptyline (ELAVIL) 25 MG tablet, TAKE 1 TABLET(25 MG) BY MOUTH AT BEDTIME, Disp: 30 tablet, Rfl: 0   cariprazine (VRAYLAR) 1.5 MG capsule, Take 1 capsule (1.5 mg total) by mouth daily., Disp: 30 capsule, Rfl: 0   escitalopram (LEXAPRO) 10 MG tablet, TAKE 1 TABLET(10 MG) BY MOUTH DAILY, Disp: 30 tablet, Rfl: 0   loratadine (CLARITIN) 10 MG tablet, Take 1 tablet (10 mg total) by mouth daily., Disp: 90 tablet, Rfl: 1  No Known Allergies  I personally reviewed  active problem list, medication list, allergies, family history, social history, health maintenance with the patient/caregiver today.   ROS  ***  Objective  There were no vitals filed for this visit.  There is no height or weight on file to calculate BMI.  Physical Exam ***  Recent Results (from the past 2160 hour(s))  CBC with Differential/Platelet     Status: None   Collection Time: 11/15/22 12:14 PM  Result Value Ref Range   WBC 7.2 3.8 - 10.8 Thousand/uL   RBC 5.56 4.20 - 5.80 Million/uL   Hemoglobin 16.1 13.2 - 17.1 g/dL   HCT 47.0 38.5 - 50.0 %   MCV 84.5 80.0 - 100.0 fL   MCH 29.0 27.0 - 33.0 pg   MCHC 34.3 32.0 - 36.0 g/dL    Comment: Specimen  was prewarmed to 37 degrees to obtain results. Cold agglutinin/cryoglobulin suspected.    RDW 12.6 11.0 - 15.0 %   Platelets 150 140 - 400 Thousand/uL   MPV 11.3 7.5 - 12.5 fL   Neutro Abs 5,119 1,500 - 7,800 cells/uL   Lymphs Abs 1,411 850 - 3,900 cells/uL   Absolute Monocytes 540 200 - 950 cells/uL   Eosinophils Absolute 79 15 - 500 cells/uL   Basophils Absolute 50 0 - 200 cells/uL   Neutrophils Relative % 71.1 %   Total Lymphocyte 19.6 %   Monocytes Relative 7.5 %   Eosinophils Relative 1.1 %   Basophils Relative 0.7 %  COMPLETE METABOLIC PANEL WITH GFR     Status: Abnormal   Collection Time: 11/15/22 12:14 PM  Result Value Ref Range   Glucose, Bld 82 65 - 99 mg/dL    Comment: .            Fasting reference interval .    BUN 16 7 - 25 mg/dL   Creat 0.82 0.60 - 1.24 mg/dL   eGFR 128 > OR = 60 mL/min/1.59m   BUN/Creatinine Ratio SEE NOTE: 6 - 22 (calc)    Comment:    Not Reported: BUN and Creatinine are within    reference range. .    Sodium 141 135 - 146 mmol/L   Potassium 3.8 3.5 - 5.3 mmol/L   Chloride 105 98 - 110 mmol/L   CO2 24 20 - 32 mmol/L   Calcium 9.4 8.6 - 10.3 mg/dL   Total Protein 7.4 6.1 - 8.1 g/dL   Albumin 4.8 3.6 - 5.1 g/dL   Globulin 2.6 1.9 - 3.7 g/dL (calc)   AG Ratio 1.8 1.0 - 2.5 (calc)   Total Bilirubin 1.4 (H) 0.2 - 1.2 mg/dL   Alkaline phosphatase (APISO) 75 36 - 130 U/L   AST 12 10 - 40 U/L   ALT 10 9 - 46 U/L  HIV Antibody (routine testing w rflx)     Status: None   Collection Time: 11/15/22 12:14 PM  Result Value Ref Range   HIV 1&2 Ab, 4th Generation NON-REACTIVE NON-REACTIVE    Comment: HIV-1 antigen and HIV-1/HIV-2 antibodies were not detected. There is no laboratory evidence of HIV infection. .Marland KitchenPLEASE NOTE: This information has been disclosed to you from records whose confidentiality may be protected by state law.  If your state requires such protection, then the state law prohibits you from making any further disclosure of  the information without the specific written consent of the person to whom it pertains, or as otherwise permitted by law. A general authorization for the release of medical or other information is NOT  sufficient for this purpose. . For additional information please refer to http://education.quest[employer].com/faq/FAQ106 (This link is being provided for informational/ educational purposes only.) . Marland Kitchen The performance of this assay has not been clinically validated in patients less than 2 years old. .   TSH     Status: None   Collection Time: 11/15/22 12:14 PM  Result Value Ref Range   TSH 0.96 0.40 - 4.50 mIU/L  RPR     Status: None   Collection Time: 11/15/22 12:14 PM  Result Value Ref Range   RPR Ser Ql NON-REACTIVE NON-REACTIVE    PHQ2/9:    12/20/2022   11:34 AM 11/28/2022    8:16 AM 11/15/2022   11:36 AM 08/06/2021   11:01 AM 03/28/2021    1:12 PM  Depression screen PHQ 2/9  Decreased Interest '1 2 2 '$ 0 0  Down, Depressed, Hopeless '3 1 1 '$ 0 0  PHQ - 2 Score '4 3 3 '$ 0 0  Altered sleeping 3 0 0 0 1  Tired, decreased energy '1 2 2 '$ 0 1  Change in appetite 0 3 3 0 1  Feeling bad or failure about yourself  '3 3 3 '$ 0 3  Trouble concentrating '3 3 3 '$ 0 3  Moving slowly or fidgety/restless 0 0 0 0 0  Suicidal thoughts 0 1 1 0 0  PHQ-9 Score '14 15 15 '$ 0 9  Difficult doing work/chores  Very difficult Very difficult Not difficult at all     phq 9 is {gen pos [id number]   Fall Risk:    12/20/2022   11:29 AM 11/28/2022    8:16 AM 11/15/2022   11:35 AM 08/06/2021   11:01 AM 03/28/2021    1:12 PM  Fall Risk   Falls in the past year? 0 0 0 0 0  Number falls in past yr: 0   0 0  Injury with Fall? 0   0 0  Risk for fall due to : No Fall Risks No Fall Risks No Fall Risks    Follow up Falls prevention discussed Falls prevention discussed;Education provided;Falls evaluation completed Falls prevention discussed;Education provided;Falls evaluation completed Falls evaluation completed Falls  evaluation completed      Functional Status Survey:      Assessment & Plan  *** There are no diagnoses linked to this encounter.

## 2023-01-10 ENCOUNTER — Ambulatory Visit: Payer: Medicaid Other | Admitting: Family Medicine

## 2023-01-10 ENCOUNTER — Ambulatory Visit (INDEPENDENT_AMBULATORY_CARE_PROVIDER_SITE_OTHER): Payer: Medicaid Other | Admitting: Family Medicine

## 2023-01-10 ENCOUNTER — Encounter: Payer: Self-pay | Admitting: Family Medicine

## 2023-01-10 VITALS — BP 114/70 | HR 96 | Temp 98.1°F | Resp 14 | Ht 74.5 in | Wt 180.5 lb

## 2023-01-10 DIAGNOSIS — K580 Irritable bowel syndrome with diarrhea: Secondary | ICD-10-CM | POA: Diagnosis not present

## 2023-01-10 DIAGNOSIS — F339 Major depressive disorder, recurrent, unspecified: Secondary | ICD-10-CM

## 2023-01-10 DIAGNOSIS — F411 Generalized anxiety disorder: Secondary | ICD-10-CM | POA: Diagnosis not present

## 2023-01-10 MED ORDER — ESCITALOPRAM OXALATE 10 MG PO TABS: ORAL_TABLET | ORAL | 0 refills | Status: DC

## 2023-01-10 NOTE — Progress Notes (Signed)
Name: Raymond Yang   MRN: [id number]    DOB: [birth year]   Date:01/10/2023       Progress Note  Subjective  Chief Complaint  Follow Up  HPI  GAD/MDD: he has a long history of depression and anxiety, he took prozac in the past , he has a long history of ADD but no on medications for years. He has seen therapists in the past. 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 He worries about bills and the cost of living. [name] was approved by insurance but pharmacy did not notify him and he has not started medication yet. He states no suicidal planning but has suicidal thoughts. He states he has had this type of suicidal thoughts since he was a teenager     IBS: he has multiple episodes of watery stools, associated with abdominal cramping and sometimes inability to finish his bowel movements. No fever or chills. It has been going on all his life.We gave him Elavil and states frequency of bowel movements decreased from 4-5 or more bowel movements during working hours but now only having it a couple of times in the 10 hour shift. He states has to skip dose sometimes since it makes him feel groggy when he takes too late. Discussed try to take all medications when he gets home from work   Patient Active Problem List   Diagnosis Date Noted   Irritable bowel syndrome with diarrhea 12/20/2022   GAD (generalized anxiety disorder) 12/20/2022   Major depression, recurrent, chronic ([place]) 11/15/2022   Marijuana use 08/07/2017   GERD (gastroesophageal reflux disease) 06/20/2017   ADD (attention deficit disorder) 05/26/2015   Insomnia, persistent 05/26/2015   Oppositional defiant disorder 05/26/2015   Seasonal allergic rhinitis 05/26/2015   History of seizure 05/26/2015    Past Surgical History:  Procedure Laterality Date   ORIF WRIST FRACTURE Right 09/30/2018    Procedure: OPEN REDUCTION INTERNAL FIXATION (ORIF) RIGHT SCAPHOID FRACTURE;  Surgeon: Corky Mull, MD;  Location: [facility];  Service: Orthopedics;  Laterality: Right;  [place]     as infant    Family History  Problem Relation Age of Onset   Migraines Mother    Anxiety disorder Mother    Obesity Mother    Sleep apnea Father    Hypertension Father    Diabetes Father    Heart disease Maternal Grandmother     Social History   Tobacco Use   Smoking status: Never   Smokeless tobacco: Never  Substance Use Topics   Alcohol use: No    Alcohol/week: 0.0 standard drinks of alcohol     Current Outpatient Medications:    amitriptyline (ELAVIL) 25 MG tablet, TAKE 1 TABLET(25 MG) BY MOUTH AT BEDTIME, Disp: 30 tablet, Rfl: 0   escitalopram (LEXAPRO) 10 MG tablet, TAKE 1 TABLET(10 MG) BY MOUTH DAILY, Disp: 30 tablet, Rfl: 0   loratadine (CLARITIN) 10 MG tablet, Take 1 tablet (10 mg total) by mouth daily., Disp: 90 tablet, Rfl: 1   cariprazine (VRAYLAR) 1.5 MG capsule, Take 1 capsule (1.5 mg total) by mouth daily. (Patient not taking: Reported on 01/10/2023), Disp: 30 capsule, Rfl: 0  No Known Allergies  I personally reviewed active problem list, medication list, allergies, family history, social history, health maintenance with the  patient/caregiver today.   ROS  Ten systems reviewed and is negative except as mentioned in HPI   Objective  Vitals:   01/10/23 1355  BP: 114/70  Pulse: 96  Resp: 14  Temp: 98.1 F (36.7 C)  TempSrc: Oral  SpO2: 99%  Weight: 180 lb 8 oz (81.9 kg)  Height: 6' 2.5" (1.892 m)    Body mass index is 22.86 kg/m.  Physical Exam  Constitutional: Patient appears well-developed and well-nourished. No distress.  HEENT: head atraumatic, normocephalic, pupils equal and reactive to light, neck supple Cardiovascular: Normal rate, regular rhythm and normal heart sounds.  No murmur  heard. No BLE edema. Pulmonary/Chest: Effort normal and breath sounds normal. No respiratory distress. Abdominal: Soft.  There is no tenderness. Psychiatric: Patient has a normal mood and affect. behavior is normal. Judgment and thought content normal.   Recent Results (from the past 2160 hour(s))  CBC with Differential/Platelet     Status: None   Collection Time: 11/15/22 12:14 PM  Result Value Ref Range   WBC 7.2 3.8 - 10.8 Thousand/uL   RBC 5.56 4.20 - 5.80 Million/uL   Hemoglobin 16.1 13.2 - 17.1 g/dL   HCT 16.1 09.6 - 04.5 %   MCV 84.5 80.0 - 100.0 fL   MCH 29.0 27.0 - 33.0 pg   MCHC 34.3 32.0 - 36.0 g/dL    Comment: Specimen was prewarmed to 37 degrees to obtain results. Cold agglutinin/cryoglobulin suspected.    RDW 12.6 11.0 - 15.0 %   Platelets 150 140 - 400 Thousand/uL   MPV 11.3 7.5 - 12.5 fL   Neutro Abs 5,119 1,500 - 7,800 cells/uL   Lymphs Abs 1,411 850 - 3,900 cells/uL   Absolute Monocytes 540 200 - 950 cells/uL   Eosinophils Absolute 79 15 - 500 cells/uL   Basophils Absolute 50 0 - 200 cells/uL   Neutrophils Relative % 71.1 %   Total Lymphocyte 19.6 %   Monocytes Relative 7.5 %   Eosinophils Relative 1.1 %   Basophils Relative 0.7 %  COMPLETE METABOLIC PANEL WITH GFR     Status: Abnormal   Collection Time: 11/15/22 12:14 PM  Result Value Ref Range   Glucose, Bld 82 65 - 99 mg/dL    Comment: .            Fasting reference interval .    BUN 16 7 - 25 mg/dL   Creat 4.09 8.11 - 9.14 mg/dL   eGFR 782 > OR = 60 NF/AOZ/3.08M5   BUN/Creatinine Ratio SEE NOTE: 6 - 22 (calc)    Comment:    Not Reported: BUN and Creatinine are within    reference range. .    Sodium 141 135 - 146 mmol/L   Potassium 3.8 3.5 - 5.3 mmol/L   Chloride 105 98 - 110 mmol/L   CO2 24 20 - 32 mmol/L   Calcium 9.4 8.6 - 10.3 mg/dL   Total Protein 7.4 6.1 - 8.1 g/dL   Albumin 4.8 3.6 - 5.1 g/dL   Globulin 2.6 1.9 - 3.7 g/dL (calc)   AG Ratio 1.8 1.0 - 2.5 (calc)   Total Bilirubin 1.4  (H) 0.2 - 1.2 mg/dL   Alkaline phosphatase (APISO) 75 36 - 130 U/L   AST 12 10 - 40 U/L   ALT 10 9 - 46 U/L  HIV Antibody (routine testing w rflx)     Status: None   Collection Time: 11/15/22 12:14 PM  Result Value Ref Range   HIV 1&2  Ab, 4th Generation NON-REACTIVE NON-REACTIVE    Comment: HIV-1 antigen and HIV-1/HIV-2 antibodies were not detected. There is no laboratory evidence of HIV infection. Marland Kitchen PLEASE NOTE: This information has been disclosed to you from records whose confidentiality may be protected by state law.  If your state requires such protection, then the state law prohibits you from making any further disclosure of the information without the specific written consent of the person to whom it pertains, or as otherwise permitted by law. A general authorization for the release of medical or other information is NOT sufficient for this purpose. . For additional information please refer to http://education.quest[employer].com/faq/FAQ106 (This link is being provided for informational/ educational purposes only.) . Marland Kitchen The performance of this assay has not been clinically validated in patients less than 58 years old. .   TSH     Status: None   Collection Time: 11/15/22 12:14 PM  Result Value Ref Range   TSH 0.96 0.40 - 4.50 mIU/L  RPR     Status: None   Collection Time: 11/15/22 12:14 PM  Result Value Ref Range   RPR Ser Ql NON-REACTIVE NON-REACTIVE    PHQ2/9:    01/10/2023    1:57 PM 01/10/2023    1:56 PM 12/20/2022   11:34 AM 11/28/2022    8:16 AM 11/15/2022   11:36 AM  Depression screen PHQ 2/9  Decreased Interest 3 3 1 2 2   Down, Depressed, Hopeless 3 3 3 1 1   PHQ - 2 Score 6 6 4 3 3   Altered sleeping 3 3 3  0 0  Tired, decreased energy 3 3 1 2 2   Change in appetite 2 2 0 3 3  Feeling bad or failure about yourself  3 3 3 3 3   Trouble concentrating 3 3 3 3 3   Moving slowly or fidgety/restless 3 2 0 0 0  Suicidal thoughts 3 3 0 1 1  PHQ-9 Score 26 25 14  15 15   Difficult doing work/chores Very difficult Very difficult  Very difficult Very difficult    phq 9 is positive   Fall Risk:    01/10/2023    1:57 PM 01/10/2023    1:56 PM 12/20/2022   11:29 AM 11/28/2022    8:16 AM 11/15/2022   11:35 AM  Fall Risk   Falls in the past year? 0 0 0 0 0  Number falls in past yr:  0 0    Injury with Fall?  0 0    Risk for fall due to : No Fall Risks No Fall Risks No Fall Risks No Fall Risks No Fall Risks  Follow up Falls prevention discussed Falls prevention discussed;Education provided;Falls evaluation completed Falls prevention discussed Falls prevention discussed;Education provided;Falls evaluation completed Falls prevention discussed;Education provided;Falls evaluation completed      Functional Status Survey: Is the patient deaf or have difficulty hearing?: No Does the patient have difficulty seeing, even when wearing glasses/contacts?: No Does the patient have difficulty concentrating, remembering, or making decisions?: No Does the patient have difficulty walking or climbing stairs?: No Does the patient have difficulty dressing or bathing?: No Does the patient have difficulty doing errands alone such as visiting a doctor's office or shopping?: No    Assessment & Plan  1. Major depression, recurrent, chronic (HCC)  - escitalopram (LEXAPRO) 10 MG tablet; TAKE 1 TABLET(10 MG) BY MOUTH DAILY  Dispense: 30 tablet; Refill: 0  2. GAD (generalized anxiety disorder)  - escitalopram (LEXAPRO) 10 MG tablet; TAKE 1 TABLET(10 MG)  BY MOUTH DAILY  Dispense: 30 tablet; Refill: 0  3. Irritable bowel syndrome with diarrhea  Improved

## 2023-01-16 ENCOUNTER — Other Ambulatory Visit: Payer: Self-pay | Admitting: Family Medicine

## 2023-01-16 DIAGNOSIS — K580 Irritable bowel syndrome with diarrhea: Secondary | ICD-10-CM

## 2023-01-30 NOTE — Progress Notes (Deleted)
Name: Raymond Yang   MRN: [id number]    DOB: [birth year]   Date:01/30/2023       Progress Note  Subjective  Chief Complaint  Follow Up  HPI  GAD/MDD: he has a long history of depression and anxiety, he took prozac in the past , he has a long history of ADD but no on medications for years. He has seen therapists in the past. 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 He worries about bills and the cost of living. [name] was approved by insurance but pharmacy did not notify him and he has not started medication yet. He states no suicidal planning but has suicidal thoughts. He states he has had this type of suicidal thoughts since he was a teenager     IBS: he has multiple episodes of watery stools, associated with abdominal cramping and sometimes inability to finish his bowel movements. No fever or chills. It has been going on all his life.We gave him Elavil and states frequency of bowel movements decreased from 4-5 or more bowel movements during working hours but now only having it a couple of times in the 10 hour shift. He states has to skip dose sometimes since it makes him feel groggy when he takes too late. Discussed try to take all medications when he gets home from work   Patient Active Problem List   Diagnosis Date Noted   Irritable bowel syndrome with diarrhea 12/20/2022   GAD (generalized anxiety disorder) 12/20/2022   Major depression, recurrent, chronic ([place]) 11/15/2022   Marijuana use 08/07/2017   GERD (gastroesophageal reflux disease) 06/20/2017   ADD (attention deficit disorder) 05/26/2015   Insomnia, persistent 05/26/2015   Oppositional defiant disorder 05/26/2015   Seasonal allergic rhinitis 05/26/2015   History of seizure 05/26/2015    Past Surgical History:  Procedure Laterality Date   ORIF WRIST FRACTURE Right 09/30/2018    Procedure: OPEN REDUCTION INTERNAL FIXATION (ORIF) RIGHT SCAPHOID FRACTURE;  Surgeon: Corky Mull, MD;  Location: [facility];  Service: Orthopedics;  Laterality: Right;  [place]     as infant    Family History  Problem Relation Age of Onset   Migraines Mother    Anxiety disorder Mother    Obesity Mother    Sleep apnea Father    Hypertension Father    Diabetes Father    Heart disease Maternal Grandmother     Social History   Tobacco Use   Smoking status: Never   Smokeless tobacco: Never  Substance Use Topics   Alcohol use: No    Alcohol/week: 0.0 standard drinks of alcohol     Current Outpatient Medications:    amitriptyline (ELAVIL) 25 MG tablet, TAKE 1 TABLET(25 MG) BY MOUTH AT BEDTIME, Disp: 30 tablet, Rfl: 0   cariprazine (VRAYLAR) 1.5 MG capsule, Take 1 capsule (1.5 mg total) by mouth daily. (Patient not taking: Reported on 01/10/2023), Disp: 30 capsule, Rfl: 0   escitalopram (LEXAPRO) 10 MG tablet, TAKE 1 TABLET(10 MG) BY MOUTH DAILY, Disp: 30 tablet, Rfl: 0   loratadine (CLARITIN) 10 MG tablet, Take 1 tablet (10 mg total) by mouth daily., Disp: 90 tablet, Rfl: 1  No Known Allergies  I personally reviewed active problem list, medication list, allergies, family history, social history, health maintenance with the  patient/caregiver today.   ROS  ***  Objective  There were no vitals filed for this visit.  There is no height or weight on file to calculate BMI.  Physical Exam ***   PHQ2/9:    01/10/2023    1:57 PM 01/10/2023    1:56 PM 12/20/2022   11:34 AM 11/28/2022    8:16 AM 11/15/2022   11:36 AM  Depression screen PHQ 2/9  Decreased Interest 3 3 1 2 2  $ Down, Depressed, Hopeless 3 3 3 1 1  $ PHQ - 2 Score 6 6 4 3 3  $ Altered sleeping 3 3 3 $ 0 0  Tired, decreased energy 3 3 1 2 2  $ Change in appetite 2 2 0 3 3  Feeling bad or failure about yourself  3 3 3 3 3  $ Trouble concentrating 3  3 3 3 3  $ Moving slowly or fidgety/restless 3 2 0 0 0  Suicidal thoughts 3 3 0 1 1  PHQ-9 Score 26 25 14 15 15  $ Difficult doing work/chores Very difficult Very difficult  Very difficult Very difficult    phq 9 is {gen pos [id number]   Fall Risk:    01/10/2023    1:57 PM 01/10/2023    1:56 PM 12/20/2022   11:29 AM 11/28/2022    8:16 AM 11/15/2022   11:35 AM  Fall Risk   Falls in the past year? 0 0 0 0 0  Number falls in past yr:  0 0    Injury with Fall?  0 0    Risk for fall due to : No Fall Risks No Fall Risks No Fall Risks No Fall Risks No Fall Risks  Follow up Falls prevention discussed Falls prevention discussed;Education provided;Falls evaluation completed Falls prevention discussed Falls prevention discussed;Education provided;Falls evaluation completed Falls prevention discussed;Education provided;Falls evaluation completed      Functional Status Survey:      Assessment & Plan  *** There are no diagnoses linked to this encounter.

## 2023-01-31 ENCOUNTER — Ambulatory Visit: Payer: Medicaid Other | Admitting: Family Medicine

## 2023-01-31 ENCOUNTER — Telehealth: Payer: Self-pay | Admitting: Family Medicine

## 2023-01-31 NOTE — Telephone Encounter (Signed)
Copied from [place] [id number]. Topic: General - Other >> Jan 31, 2023  1:52 PM [name] F wrote: Reason for [id number] had an appointment today and missed it. Patient wanted to know if Dr. Ancil Boozer could fit him in today.

## 2023-01-31 NOTE — Telephone Encounter (Signed)
Spoke with pt and I scheduled him for 8am on Wednesday 2.21.2024 as a virtual visit (due to him not being able to get off work). He would like to know if you could call him closer toward 9 when he goes on break?

## 2023-01-31 NOTE — Telephone Encounter (Signed)
Spoke with pt and he is coming into the office

## 2023-02-04 NOTE — Progress Notes (Unsigned)
Name: Raymond Yang   MRN: [id number]    DOB: [birth year]   Date:02/05/2023       Progress Note  Subjective  Chief Complaint  Follow Up  HPI  GAD/MDD: he has a long history of depression and anxiety, he took prozac in the past , he was also diagnosed with ADD as a child but has been off medications for years. He has seen therapists in the past. Currently lives alone. His phq 9 is still very high but significantly better since last month when we started him on Vraylar - he is also taking lexapro 10 mg. He is very happy with results and at this time does not want to change dose of medications. He continues to worry about bills but applied for another job that he plans on working on weekends. He is waiting for the response - he will be working at [employer]. His suicidal thoughts are occasional, no plans and states.    IBS: he has multiple episodes of watery stools, associated with abdominal cramping and sometimes inability to finish his bowel movements. No fever or chills. It has been going on all his life.We gave him Elavil and states frequency of bowel movements had initially  decreased from 4-5 or more bowel movements during working hours to only a couple of times in the 10 hour shift. He had an episode of gastroenteritis a few days ago and felt nauseous at the time. Back to baseline today. He asked if he could go up on Elavil, discussed drug and drug interaction with lexapro. We will try Xifaxin for 2 weeks. Return in 2 weeks for follow up  Patient Active Problem List   Diagnosis Date Noted   Irritable bowel syndrome with diarrhea 12/20/2022   GAD (generalized anxiety disorder) 12/20/2022   Major depression, recurrent, chronic ([place]) 11/15/2022   Marijuana use 08/07/2017   GERD (gastroesophageal reflux disease) 06/20/2017   ADD (attention deficit disorder) 05/26/2015   Insomnia, persistent 05/26/2015   Oppositional defiant disorder 05/26/2015   Seasonal allergic rhinitis  05/26/2015   History of seizure 05/26/2015    Past Surgical History:  Procedure Laterality Date   ORIF WRIST FRACTURE Right 09/30/2018   Procedure: OPEN REDUCTION INTERNAL FIXATION (ORIF) RIGHT SCAPHOID FRACTURE;  Surgeon: Corky Mull, MD;  Location: [facility];  Service: Orthopedics;  Laterality: Right;  [place]     as infant    Family History  Problem Relation Age of Onset   Migraines Mother    Anxiety disorder Mother    Obesity Mother    Sleep apnea Father    Hypertension Father    Diabetes Father    Heart disease Maternal Grandmother     Social History   Tobacco Use   Smoking status: Never   Smokeless tobacco: Never  Substance Use Topics   Alcohol use: No    Alcohol/week: 0.0 standard drinks of alcohol     Current Outpatient Medications:    amitriptyline (ELAVIL) 25 MG tablet, TAKE 1 TABLET(25 MG) BY MOUTH AT BEDTIME, Disp: 30 tablet, Rfl: 0   cariprazine (VRAYLAR) 1.5 MG capsule, Take 1 capsule (1.5 mg total) by mouth daily., Disp: 30 capsule, Rfl: 0   escitalopram (LEXAPRO) 10 MG tablet, TAKE 1 TABLET(10 MG) BY MOUTH DAILY, Disp: 30 tablet, Rfl: 0   loratadine (CLARITIN) 10 MG tablet, Take 1 tablet (10 mg total) by mouth daily., Disp: 90 tablet, Rfl: 1  No  Known Allergies  I personally reviewed active problem list, medication list, allergies, family history, social history, health maintenance with the patient/caregiver today.   ROS  Ten systems reviewed and is negative except as mentioned in HPI \   Objective  Vitals:   02/05/23 0809  BP: 112/72  Pulse: 76  Resp: 16  Temp: 98.7 F (37.1 C)  TempSrc: Oral  SpO2: 99%  Weight: 187 lb 14.4 oz (85.2 kg)  Height: 6' 2.5" (1.892 m)    Body mass index is 23.8 kg/m.  Physical Exam  Constitutional: Patient appears well-developed and well-nourished.  No distress.  HEENT: head atraumatic, normocephalic, pupils equal and reactive  to light, neck supple Cardiovascular: Normal rate, regular rhythm and normal heart sounds.  No murmur heard. No BLE edema. Pulmonary/Chest: Effort normal and breath sounds normal. No respiratory distress. Abdominal: Soft.  There is no tenderness. Psychiatric: Patient has a normal mood and affect. behavior is normal. Judgment and thought content normal.    PHQ2/9:    02/05/2023    8:11 AM 01/10/2023    1:57 PM 01/10/2023    1:56 PM 12/20/2022   11:34 AM 11/28/2022    8:16 AM  Depression screen PHQ 2/9  Decreased Interest 2 3 3 1 2  $ Down, Depressed, Hopeless 1 3 3 3 1  $ PHQ - 2 Score 3 6 6 4 3  $ Altered sleeping 2 3 3 3 $ 0  Tired, decreased energy 2 3 3 1 2  $ Change in appetite 2 2 2 $ 0 3  Feeling bad or failure about yourself  3 3 3 3 3  $ Trouble concentrating 3 3 3 3 3  $ Moving slowly or fidgety/restless 1 3 2 $ 0 0  Suicidal thoughts 3 3 3 $ 0 1  PHQ-9 Score 19 26 25 14 15  $ Difficult doing work/chores Very difficult Very difficult Very difficult  Very difficult    phq 9 is positive   Fall Risk:    02/05/2023    8:10 AM 01/10/2023    1:57 PM 01/10/2023    1:56 PM 12/20/2022   11:29 AM 11/28/2022    8:16 AM  Fall Risk   Falls in the past year? 0 0 0 0 0  Number falls in past yr:   0 0   Injury with Fall?   0 0   Risk for fall due to : No Fall Risks No Fall Risks No Fall Risks No Fall Risks No Fall Risks  Follow up Falls prevention discussed Falls prevention discussed Falls prevention discussed;Education provided;Falls evaluation completed Falls prevention discussed Falls prevention discussed;Education provided;Falls evaluation completed     Functional Status Survey: Is the patient deaf or have difficulty hearing?: No Does the patient have difficulty seeing, even when wearing glasses/contacts?: No Does the patient have difficulty concentrating, remembering, or making decisions?: No Does the patient have difficulty walking or climbing stairs?: No Does the patient have difficulty  dressing or bathing?: No Does the patient have difficulty doing errands alone such as visiting a doctor's office or shopping?: No    Assessment & Plan  1. Major depression, recurrent, chronic ([facility])  - escitalopram (LEXAPRO) 10 MG tablet; TAKE 1 TABLET(10 MG) BY MOUTH DAILY  Dispense: 90 tablet; Refill: 0 - cariprazine (VRAYLAR) 1.5 MG capsule; Take 1 capsule (1.5 mg total) by mouth daily.  Dispense: 90 capsule; Refill: 0  2. GAD (generalized anxiety disorder)  - escitalopram (LEXAPRO) 10 MG tablet; TAKE 1 TABLET(10 MG) BY MOUTH DAILY  Dispense: 90 tablet; Refill: 0  3. Irritable bowel syndrome with diarrhea  - rifaximin (XIFAXAN) 550 MG TABS tablet; Take 1 tablet (550 mg total) by mouth 3 (three) times daily.  Dispense: 42 tablet; Refill: 0 - dicyclomine (BENTYL) 10 MG capsule; Take 1 capsule (10 mg total) by mouth 4 (four) times daily -  before meals and at bedtime.  Dispense: 90 capsule; Refill: 0 - amitriptyline (ELAVIL) 25 MG tablet; Take 1 tablet (25 mg total) by mouth at bedtime.  Dispense: 90 tablet; Refill: 0

## 2023-02-05 ENCOUNTER — Other Ambulatory Visit: Payer: Self-pay | Admitting: Family Medicine

## 2023-02-05 ENCOUNTER — Encounter: Payer: Self-pay | Admitting: Family Medicine

## 2023-02-05 ENCOUNTER — Ambulatory Visit (INDEPENDENT_AMBULATORY_CARE_PROVIDER_SITE_OTHER): Payer: Medicaid Other | Admitting: Family Medicine

## 2023-02-05 VITALS — BP 112/72 | HR 76 | Temp 98.7°F | Resp 16 | Ht 74.5 in | Wt 187.9 lb

## 2023-02-05 DIAGNOSIS — K580 Irritable bowel syndrome with diarrhea: Secondary | ICD-10-CM

## 2023-02-05 DIAGNOSIS — F411 Generalized anxiety disorder: Secondary | ICD-10-CM | POA: Diagnosis not present

## 2023-02-05 DIAGNOSIS — F339 Major depressive disorder, recurrent, unspecified: Secondary | ICD-10-CM

## 2023-02-05 MED ORDER — CARIPRAZINE HCL 1.5 MG PO CAPS: 1.5000 mg | ORAL_CAPSULE | Freq: Every day | ORAL | 0 refills | Status: DC

## 2023-02-05 MED ORDER — DICYCLOMINE HCL 10 MG PO CAPS: 10.0000 mg | ORAL_CAPSULE | Freq: Three times a day (TID) | ORAL | 0 refills | Status: DC

## 2023-02-05 MED ORDER — AMITRIPTYLINE HCL 25 MG PO TABS: 25.0000 mg | ORAL_TABLET | Freq: Every day | ORAL | 0 refills | Status: DC

## 2023-02-05 MED ORDER — RIFAXIMIN 550 MG PO TABS: 550.0000 mg | ORAL_TABLET | Freq: Three times a day (TID) | ORAL | 0 refills | Status: DC

## 2023-02-05 MED ORDER — ESCITALOPRAM OXALATE 10 MG PO TABS: ORAL_TABLET | ORAL | 0 refills | Status: DC

## 2023-02-10 ENCOUNTER — Encounter: Payer: Self-pay | Admitting: Emergency Medicine

## 2023-02-10 ENCOUNTER — Emergency Department
Admission: EM | Admit: 2023-02-10 | Discharge: 2023-02-10 | Disposition: A | Discharge status: Home / Self Care | Payer: Medicaid Other | Attending: Emergency Medicine | Admitting: Emergency Medicine

## 2023-02-10 DIAGNOSIS — T23072A Burn of unspecified degree of left wrist, initial encounter: Secondary | ICD-10-CM | POA: Diagnosis not present

## 2023-02-10 DIAGNOSIS — T754XXA Electrocution, initial encounter: Secondary | ICD-10-CM | POA: Insufficient documentation

## 2023-02-10 DIAGNOSIS — T300 Burn of unspecified body region, unspecified degree: Secondary | ICD-10-CM

## 2023-02-10 DIAGNOSIS — T310 Burns involving less than 10% of body surface: Secondary | ICD-10-CM | POA: Diagnosis not present

## 2023-02-10 DIAGNOSIS — R9431 Abnormal electrocardiogram [ECG] [EKG]: Secondary | ICD-10-CM | POA: Diagnosis not present

## 2023-02-10 DIAGNOSIS — Z23 Encounter for immunization: Secondary | ICD-10-CM | POA: Diagnosis not present

## 2023-02-10 LAB — BASIC METABOLIC PANEL
Anion gap: 14 (ref 5–15)
BUN: 17 mg/dL (ref 6–20)
CO2: 21 mmol/L — ABNORMAL LOW (ref 22–32)
Calcium: 9.1 mg/dL (ref 8.9–10.3)
Chloride: 102 mmol/L (ref 98–111)
Creatinine, Ser: 0.83 mg/dL (ref 0.61–1.24)
GFR, Estimated: >60 mL/min (ref >60)
Glucose, Bld: 96 mg/dL (ref 70–99)
Potassium: 3.3 mmol/L — ABNORMAL LOW (ref 3.5–5.1)
Sodium: 137 mmol/L (ref 135–145)

## 2023-02-10 LAB — CBC WITH DIFFERENTIAL/PLATELET
Abs Immature Granulocytes: 0.04 10*3/uL (ref 0.00–0.07)
Basophils Absolute: 0.1 10*3/uL (ref 0.0–0.1)
Basophils Relative: 1 %
Eosinophils Absolute: 0.1 10*3/uL (ref 0.0–0.5)
Eosinophils Relative: 1 %
HCT: 47.1 % (ref 39.0–52.0)
Hemoglobin: 15.4 g/dL (ref 13.0–17.0)
Immature Granulocytes: 0 %
Lymphocytes Relative: 22 %
Lymphs Abs: 2.3 10*3/uL (ref 0.7–4.0)
MCH: 27.3 pg (ref 26.0–34.0)
MCHC: 32.7 g/dL (ref 30.0–36.0)
MCV: 83.5 fL (ref 80.0–100.0)
Monocytes Absolute: 0.7 10*3/uL (ref 0.1–1.0)
Monocytes Relative: 7 %
Neutro Abs: 7.2 10*3/uL (ref 1.7–7.7)
Neutrophils Relative %: 69 %
Platelets: 185 10*3/uL (ref 150–400)
RBC: 5.64 MIL/uL (ref 4.22–5.81)
RDW: 12.5 % (ref 11.5–15.5)
WBC: 10.4 10*3/uL (ref 4.0–10.5)
nRBC: 0 % (ref 0.0–0.2)

## 2023-02-10 LAB — CK: Total CK: 115 U/L (ref 49–397)

## 2023-02-10 MED ORDER — BACITRACIN ZINC 500 UNIT/GM EX OINT
TOPICAL_OINTMENT | Freq: Once | CUTANEOUS | Status: AC
  Administered 2023-02-10: 1 via TOPICAL
  Filled 2023-02-10: qty 0.9

## 2023-02-10 MED ORDER — TETANUS-DIPHTH-ACELL PERTUSSIS 5-2.5-18.5 LF-MCG/0.5 IM SUSY
0.5000 mL | PREFILLED_SYRINGE | Freq: Once | INTRAMUSCULAR | Status: AC
  Administered 2023-02-10: 0.5 mL via INTRAMUSCULAR
  Filled 2023-02-10: qty 0.5

## 2023-02-10 NOTE — ED Provider Notes (Signed)
ED ECG REPORT I, Raymond Yang, the attending physician, personally viewed and interpreted this ECG.  Date: 02/10/2023 EKG Time: 1821 Rate: 70 Rhythm: normal sinus rhythm QRS Axis: normal Intervals: normal ST/T Wave abnormalities: normal Narrative Interpretation: no evidence of acute ischemia    Raymond Silence, MD 02/10/23 1857
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
## 2023-02-18 ENCOUNTER — Other Ambulatory Visit: Payer: Self-pay | Admitting: Family Medicine

## 2023-02-18 DIAGNOSIS — K580 Irritable bowel syndrome with diarrhea: Secondary | ICD-10-CM

## 2023-03-11 ENCOUNTER — Encounter: Payer: Self-pay | Admitting: Family Medicine

## 2023-03-11 ENCOUNTER — Ambulatory Visit: Payer: Medicaid Other | Admitting: Family Medicine

## 2023-03-11 ENCOUNTER — Other Ambulatory Visit (HOSPITAL_COMMUNITY)
Admission: RE | Admit: 2023-03-11 | Discharge: 2023-03-11 | Disposition: A | Discharge status: Home / Self Care | Payer: Medicaid Other | Source: Ambulatory Visit | Attending: Family Medicine | Admitting: Family Medicine

## 2023-03-11 VITALS — BP 112/72 | HR 72 | Temp 98.1°F | Resp 16 | Ht 74.5 in | Wt 196.7 lb

## 2023-03-11 DIAGNOSIS — R21 Rash and other nonspecific skin eruption: Secondary | ICD-10-CM | POA: Diagnosis not present

## 2023-03-11 DIAGNOSIS — Z113 Encounter for screening for infections with a predominantly sexual mode of transmission: Secondary | ICD-10-CM | POA: Diagnosis not present

## 2023-03-11 MED ORDER — CLOTRIMAZOLE-BETAMETHASONE 1-0.05 % EX CREA: 1.0000 | TOPICAL_CREAM | Freq: Two times a day (BID) | CUTANEOUS | 0 refills | Status: DC | PRN

## 2023-03-11 NOTE — Progress Notes (Signed)
Patient ID: Raymond Yang, male    DOB: October 05, 2001, 22 y.o.   MRN: [id number]  PCP: Alba Cory, MD  Chief Complaint  Patient presents with   Exposure to STD    Pt states just regular check up for STD   Nail Problem    Right foot ring/pinky toe had blue color the past 2 days but disappeared and still having pain.    Subjective:   Raymond Yang is a 22 y.o. male, presents to clinic with CC of the following:  HPI  STD screening, he's worried about his past STD asymptomatic with chlamydia Penile lesions/rash/bumps that have come and gone - a little burning went away spontaneously No current dysuria, hematuria, testicular or scrotal pain, abdominal pain, nausea, vomiting, eye symptoms or joint pain  He also is worrying about one of his toes that had a blue color but has returned to normal -asymptomatic right now on normal-appearing    Patient Active Problem List   Diagnosis Date Noted   Irritable bowel syndrome with diarrhea 12/20/2022   GAD (generalized anxiety disorder) 12/20/2022   Major depression, recurrent, chronic (HCC) 11/15/2022   Marijuana use 08/07/2017   GERD (gastroesophageal reflux disease) 06/20/2017   ADD (attention deficit disorder) 05/26/2015   Insomnia, persistent 05/26/2015   Oppositional defiant disorder 05/26/2015   Seasonal allergic rhinitis 05/26/2015   History of seizure 05/26/2015      Current Outpatient Medications:    amitriptyline (ELAVIL) 25 MG tablet, Take 1 tablet (25 mg total) by mouth at bedtime., Disp: 90 tablet, Rfl: 0   cariprazine (VRAYLAR) 1.5 MG capsule, Take 1 capsule (1.5 mg total) by mouth daily., Disp: 90 capsule, Rfl: 0   dicyclomine (BENTYL) 10 MG capsule, Take 1 capsule (10 mg total) by mouth 4 (four) times daily -  before meals and at bedtime., Disp: 90 capsule, Rfl: 0   escitalopram (LEXAPRO) 10 MG tablet, TAKE 1 TABLET(10 MG) BY MOUTH DAILY, Disp: 90 tablet, Rfl: 0   loratadine (CLARITIN) 10 MG tablet, Take 1 tablet  (10 mg total) by mouth daily., Disp: 90 tablet, Rfl: 1   rifaximin (XIFAXAN) 550 MG TABS tablet, Take 1 tablet (550 mg total) by mouth 3 (three) times daily., Disp: 42 tablet, Rfl: 0   No Known Allergies   Social History   Tobacco Use   Smoking status: Never   Smokeless tobacco: Never  Vaping Use   Vaping Use: Every day   Start date: 02/08/2018   Substances: Nicotine   Devices: Geek[employer], pulse  Substance Use Topics   Alcohol use: No    Alcohol/week: 0.0 standard drinks of alcohol   Drug use: Yes    Frequency: 3.0 times per week    Types: Marijuana      Chart Review Today: I have reviewed the patient's medical history in detail and updated the computerized patient record.   Review of Systems  Constitutional: Negative.   HENT: Negative.    Eyes: Negative.   Respiratory: Negative.    Cardiovascular: Negative.   Gastrointestinal: Negative.   Endocrine: Negative.   Genitourinary: Negative.   Musculoskeletal: Negative.   Skin: Negative.   Allergic/Immunologic: Negative.   Neurological: Negative.   Hematological: Negative.   Psychiatric/Behavioral: Negative.    All other systems reviewed and are negative.      Objective:   Vitals:   03/11/23 0915  BP: 112/72  Pulse: 72  Resp: 16  Temp: 98.1 F (36.7 C)  TempSrc: Oral  SpO2: 99%  Weight: 196 lb 11.2 oz (89.2 kg)  Height: 6' 2.5" (1.892 m)    Body mass index is 24.92 kg/m.  Physical Exam Vitals and nursing note reviewed.  Constitutional:      General: He is not in acute distress.    Appearance: Normal appearance. He is well-developed. He is not ill-appearing, toxic-appearing or diaphoretic.  HENT:     Head: Normocephalic and atraumatic.     Nose: Nose normal.  Eyes:     General:        Right eye: No discharge.        Left eye: No discharge.     Conjunctiva/sclera: Conjunctivae normal.  Neck:     Trachea: No tracheal deviation.  Cardiovascular:     Rate and Rhythm: Normal rate and regular rhythm.   Pulmonary:     Effort: Pulmonary effort is normal. No respiratory distress.     Breath sounds: No stridor.  Abdominal:     General: Abdomen is flat. Bowel sounds are normal.     Palpations: Abdomen is soft.     Tenderness: There is no abdominal tenderness.  Musculoskeletal:        General: Normal range of motion.  Skin:    General: Skin is warm and dry.     Findings: No rash.  Neurological:     Mental Status: He is alert.     Motor: No abnormal muscle tone.     Coordination: Coordination normal.  Psychiatric:        Behavior: Behavior normal.      Results for orders placed or performed during the hospital encounter of 02/10/23  CBC with Differential  Result Value Ref Range   WBC 10.4 4.0 - 10.5 K/uL   RBC 5.64 4.22 - 5.81 MIL/uL   Hemoglobin 15.4 13.0 - 17.0 g/dL   HCT 16.1 09.6 - 04.5 %   MCV 83.5 80.0 - 100.0 fL   MCH 27.3 26.0 - 34.0 pg   MCHC 32.7 30.0 - 36.0 g/dL   RDW 40.9 81.1 - 91.4 %   Platelets 185 150 - 400 K/uL   nRBC 0.0 0.0 - 0.2 %   Neutrophils Relative % 69 %   Neutro Abs 7.2 1.7 - 7.7 K/uL   Lymphocytes Relative 22 %   Lymphs Abs 2.3 0.7 - 4.0 K/uL   Monocytes Relative 7 %   Monocytes Absolute 0.7 0.1 - 1.0 K/uL   Eosinophils Relative 1 %   Eosinophils Absolute 0.1 0.0 - 0.5 K/uL   Basophils Relative 1 %   Basophils Absolute 0.1 0.0 - 0.1 K/uL   Immature Granulocytes 0 %   Abs Immature Granulocytes 0.04 0.00 - 0.07 K/uL  Basic metabolic panel  Result Value Ref Range   Sodium 137 135 - 145 mmol/L   Potassium 3.3 (L) 3.5 - 5.1 mmol/L   Chloride 102 98 - 111 mmol/L   CO2 21 (L) 22 - 32 mmol/L   Glucose, Bld 96 70 - 99 mg/dL   BUN 17 6 - 20 mg/dL   Creatinine, Ser 7.82 0.61 - 1.24 mg/dL   Calcium 9.1 8.9 - 95.6 mg/dL   GFR, Estimated >21 >30 mL/min   Anion gap 14 5 - 15  CK  Result Value Ref Range   Total CK 115 49 - 397 U/L       Assessment & Plan:   1. Screening examination for STD (sexually transmitted disease) Currently asx, prior  chlamydia, pt wants to monitor with prior asx  STD - Urine cytology ancillary only - HIV antibody (with reflex) - RPR  2. Rash of foot Rash/nail issue, brief sx, not present currently in nail - rash/peeling may be athletes foot - can try OTC tx, reviewed meds and management of shoes as well  3. Rash of genitalia All sx here also resolved Screen for STDs Encouraged pt to return to office when having sx or rash so we can see to better evaluate - Urine cytology ancillary only  Recommended to complete HPV vaccines    Danelle Berry, PA-C 03/11/23 9:44 AM

## 2023-03-12 LAB — URINE CYTOLOGY ANCILLARY ONLY
Chlamydia: NEGATIVE
Comment: NEGATIVE
Comment: NORMAL
Neisseria Gonorrhea: NEGATIVE

## 2023-03-12 LAB — RPR: RPR Ser Ql: NONREACTIVE

## 2023-03-12 LAB — HIV ANTIBODY (ROUTINE TESTING W REFLEX): HIV 1&2 Ab, 4th Generation: NONREACTIVE

## 2023-03-19 NOTE — Progress Notes (Unsigned)
Name: Raymond Yang   MRN: [id number]    DOB: December 22, 2000   Date:03/20/2023       Progress Note  Subjective  Chief Complaint  Follow Up  HPI  GAD/MDD: he has a long history of depression and anxiety, he took prozac in the past , he was also diagnosed with ADD as a child but has been off medications for years. He has seen therapists in the past. Currently lives alone. His phq 9 improved with Vraylar and lexapro, however he was fired last week and is very worried about his future. He wants to continue medications. Looking into temp agencies , his lease is about to expire, his mother is not taking him back but he will reach out to his brother [place] to see if he can move in - however very worried since he has 3 cats and one dog    IBS: he has multiple episodes of watery stools, associated with abdominal cramping and sometimes inability to finish his bowel movements. Elavil helped and he also took [place] and is doing better Feb 2024. He states he has noticed improvement of symptoms.    Patient Active Problem List   Diagnosis Date Noted   Irritable bowel syndrome with diarrhea 12/20/2022   GAD (generalized anxiety disorder) 12/20/2022   Major depression, recurrent, chronic 11/15/2022   Marijuana use 08/07/2017   GERD (gastroesophageal reflux disease) 06/20/2017   ADD (attention deficit disorder) 05/26/2015   Insomnia, persistent 05/26/2015   Oppositional defiant disorder 05/26/2015   Seasonal allergic rhinitis 05/26/2015   History of seizure 05/26/2015    Past Surgical History:  Procedure Laterality Date   ORIF WRIST FRACTURE Right 09/30/2018   Procedure: OPEN REDUCTION INTERNAL FIXATION (ORIF) RIGHT SCAPHOID FRACTURE;  Surgeon: Corky Mull, MD;  Location: [place];  Service: Orthopedics;  Laterality: Right;  [place]     as infant    Family History  Problem Relation Age of Onset   Migraines Mother    Anxiety  disorder Mother    Obesity Mother    Sleep apnea Father    Hypertension Father    Diabetes Father    Heart disease Maternal Grandmother     Social History   Tobacco Use   Smoking status: Never   Smokeless tobacco: Never  Substance Use Topics   Alcohol use: No    Alcohol/week: 0.0 standard drinks of alcohol     Current Outpatient Medications:    amitriptyline (ELAVIL) 25 MG tablet, Take 1 tablet (25 mg total) by mouth at bedtime., Disp: 90 tablet, Rfl: 0   cariprazine (VRAYLAR) 1.5 MG capsule, Take 1 capsule (1.5 mg total) by mouth daily., Disp: 90 capsule, Rfl: 0   clotrimazole-betamethasone (LOTRISONE) cream, Apply 1 Application topically 2 (two) times daily as needed (rash, itch burning). Affected area for up to 2 weeks, Disp: 30 g, Rfl: 0   escitalopram (LEXAPRO) 10 MG tablet, TAKE 1 TABLET(10 MG) BY MOUTH DAILY, Disp: 90 tablet, Rfl: 0   loratadine (CLARITIN) 10 MG tablet, Take 1 tablet (10 mg total) by mouth daily., Disp: 90 tablet, Rfl: 1   rifaximin (XIFAXAN) 550 MG TABS tablet, Take 1 tablet (550 mg total) by mouth 3 (three) times daily., Disp: 42 tablet, Rfl: 0   dicyclomine (BENTYL) 10 MG capsule, Take 1 capsule (10 mg total) by mouth 4 (four) times daily -  before meals and at bedtime. (Patient not taking: Reported on 03/20/2023), Disp:  90 capsule, Rfl: 0  No Known Allergies  I personally reviewed active problem list, medication list, allergies, family history, social history, health maintenance with the patient/caregiver today.   ROS  Ten systems reviewed and is negative except as mentioned in HPI   Objective  Vitals:   03/20/23 1038  BP: 114/68  Pulse: 89  Resp: 16  SpO2: 96%  Weight: 196 lb (88.9 kg)  Height: 6\' 2"  (1.88 m)    Body mass index is 25.16 kg/m.  Physical Exam  Constitutional: Patient appears well-developed and well-nourished.  No distress.  HEENT: head atraumatic, normocephalic, pupils equal and reactive to light, neck  supple Cardiovascular: Normal rate, regular rhythm and normal heart sounds.  No murmur heard. No BLE edema. Pulmonary/Chest: Effort normal and breath sounds normal. No respiratory distress. Abdominal: Soft.  There is no tenderness. Psychiatric: Patient seems depressed. . behavior is normal. Judgment and thought content normal.    PHQ2/9:    03/20/2023   10:37 AM 03/11/2023    9:15 AM 02/05/2023    8:11 AM 01/10/2023    1:57 PM 01/10/2023    1:56 PM  Depression screen PHQ 2/9  Decreased Interest 3 0 2 3 3   Down, Depressed, Hopeless 3 0 1 3 3   PHQ - 2 Score 6 0 3 6 6   Altered sleeping 0 0 2 3 3   Tired, decreased energy 0 0 2 3 3   Change in appetite 0 0 2 2 2   Feeling bad or failure about yourself  3 0 3 3 3   Trouble concentrating 0 0 3 3 3   Moving slowly or fidgety/restless 0 0 1 3 2   Suicidal thoughts 1 0 3 3 3   PHQ-9 Score 10 0 19 26 25   Difficult doing work/chores  Not difficult at all Very difficult Very difficult Very difficult    phq 9 is positive   Fall Risk:    03/20/2023   10:37 AM 03/11/2023    9:14 AM 02/05/2023    8:10 AM 01/10/2023    1:57 PM 01/10/2023    1:56 PM  Fall Risk   Falls in the past year? 0 0 0 0 0  Number falls in past yr: 0 0   0  Injury with Fall? 0 0   0  Risk for fall due to : No Fall Risks No Fall Risks No Fall Risks No Fall Risks No Fall Risks  Follow up Falls prevention discussed Falls prevention discussed;Education provided;Falls evaluation completed Falls prevention discussed Falls prevention discussed Falls prevention discussed;Education provided;Falls evaluation completed      Functional Status Survey: Is the patient deaf or have difficulty hearing?: No Does the patient have difficulty seeing, even when wearing glasses/contacts?: No Does the patient have difficulty concentrating, remembering, or making decisions?: No Does the patient have difficulty walking or climbing stairs?: No Does the patient have difficulty dressing or bathing?:  No Does the patient have difficulty doing errands alone such as visiting a doctor's office or shopping?: No   Assessment & Plan  1. Major depression, recurrent, chronic  Doing better  2. GAD (generalized anxiety disorder)  stable  3. Irritable bowel syndrome with diarrhea   improved

## 2023-03-20 ENCOUNTER — Ambulatory Visit (INDEPENDENT_AMBULATORY_CARE_PROVIDER_SITE_OTHER): Payer: Medicaid Other | Admitting: Family Medicine

## 2023-03-20 VITALS — BP 114/68 | HR 89 | Resp 16 | Ht 74.0 in | Wt 196.0 lb

## 2023-03-20 DIAGNOSIS — F339 Major depressive disorder, recurrent, unspecified: Secondary | ICD-10-CM

## 2023-03-20 DIAGNOSIS — K580 Irritable bowel syndrome with diarrhea: Secondary | ICD-10-CM

## 2023-03-20 DIAGNOSIS — F411 Generalized anxiety disorder: Secondary | ICD-10-CM

## 2023-04-22 ENCOUNTER — Emergency Department
Admission: EM | Admit: 2023-04-22 | Discharge: 2023-04-22 | Disposition: A | Discharge status: Home / Self Care | Payer: Medicaid Other | Attending: Emergency Medicine | Admitting: Emergency Medicine

## 2023-04-22 ENCOUNTER — Other Ambulatory Visit: Payer: Self-pay

## 2023-04-22 ENCOUNTER — Encounter: Payer: Self-pay | Admitting: Emergency Medicine

## 2023-04-22 ENCOUNTER — Emergency Department: Payer: Medicaid Other

## 2023-04-22 DIAGNOSIS — W260XXA Contact with knife, initial encounter: Secondary | ICD-10-CM | POA: Insufficient documentation

## 2023-04-22 DIAGNOSIS — S60946A Unspecified superficial injury of right little finger, initial encounter: Secondary | ICD-10-CM | POA: Diagnosis present

## 2023-04-22 DIAGNOSIS — S60921A Unspecified superficial injury of right hand, initial encounter: Secondary | ICD-10-CM | POA: Insufficient documentation

## 2023-04-22 DIAGNOSIS — S61216A Laceration without foreign body of right little finger without damage to nail, initial encounter: Secondary | ICD-10-CM

## 2023-04-22 DIAGNOSIS — S66801A Unspecified injury of other specified muscles, fascia and tendons at wrist and hand level, right hand, initial encounter: Secondary | ICD-10-CM

## 2023-04-22 DIAGNOSIS — S66104A Unspecified injury of flexor muscle, fascia and tendon of right ring finger at wrist and hand level, initial encounter: Secondary | ICD-10-CM | POA: Diagnosis not present

## 2023-04-22 MED ORDER — CEPHALEXIN 500 MG PO CAPS: 500.0000 mg | ORAL_CAPSULE | Freq: Four times a day (QID) | ORAL | 0 refills | Status: AC

## 2023-04-22 MED ORDER — LIDOCAINE HCL 1 % IJ SOLN
10.0000 mL | Freq: Once | INTRAMUSCULAR | Status: DC
  Filled 2023-04-22: qty 10

## 2023-04-22 MED ORDER — ONDANSETRON 4 MG PO TBDP
4.0000 mg | ORAL_TABLET | Freq: Once | ORAL | Status: AC
  Administered 2023-04-22: 4 mg via ORAL
  Filled 2023-04-22: qty 1

## 2023-04-22 MED ORDER — OXYCODONE-ACETAMINOPHEN 5-325 MG PO TABS
1.0000 | ORAL_TABLET | Freq: Once | ORAL | Status: AC
  Administered 2023-04-22: 1 via ORAL
  Filled 2023-04-22: qty 1

## 2023-04-22 NOTE — Progress Notes (Unsigned)
Name: Raymond Yang   MRN: [id number]    DOB: 08-15-2001   Date:04/23/2023       Progress Note  Subjective  Chief Complaint  Follow Up  HPI  GAD/MDD: he has a long history of depression and anxiety, he took prozac in the past , he was also diagnosed with ADD as a child but has been off medications for years. He has seen therapists in the past. Currently lives alone. His phq 9 improved with Vraylar and lexapro, however he lost his job in the end of March and stopped taking it due to lack of routine, he has a new job now but tried taking medication and felt l ike a zombie so he stopped taking it again . He will try to resume lexapro first and after a week resume vraylar and see if he can tolerate it again    IBS: he was having  multiple episodes of watery stools, associated with abdominal cramping and sometimes inability to finish his bowel movements. Elavil helped but since he took  XifaxinFeb 2024 symptoms resolved and he stopped elavil   [place]th [place]: went [facility] and will see Ortho today Tdap is up to date   Patient Active Problem List   Diagnosis Date Noted   Irritable bowel syndrome with diarrhea 12/20/2022   GAD (generalized anxiety disorder) 12/20/2022   Major depression, recurrent, chronic (HCC) 11/15/2022   Marijuana use 08/07/2017   GERD (gastroesophageal reflux disease) 06/20/2017   ADD (attention deficit disorder) 05/26/2015   Insomnia, persistent 05/26/2015   Oppositional defiant disorder 05/26/2015   Seasonal allergic rhinitis 05/26/2015   History of seizure 05/26/2015    Past Surgical History:  Procedure Laterality Date   ORIF WRIST FRACTURE Right 09/30/2018   Procedure: OPEN REDUCTION INTERNAL FIXATION (ORIF) RIGHT SCAPHOID FRACTURE;  Surgeon: Christena Flake, MD;  Location: [facility];  Service: Orthopedics;  Laterality: Right;  ZIMMER HERBERT SCREW SYSTEM  FLUROSCAN   TYMPANOSTOMY TUBE PLACEMENT     as infant     Family History  Problem Relation Age of Onset   Migraines Mother    Anxiety disorder Mother    Obesity Mother    Sleep apnea Father    Hypertension Father    Diabetes Father    Heart disease Maternal Grandmother     Social History   Tobacco Use   Smoking status: Never   Smokeless tobacco: Never  Substance Use Topics   Alcohol use: No    Alcohol/week: 0.0 standard drinks of alcohol     Current Outpatient Medications:    amitriptyline (ELAVIL) 25 MG tablet, Take 1 tablet (25 mg total) by mouth at bedtime., Disp: 90 tablet, Rfl: 0   cariprazine (VRAYLAR) 1.5 MG capsule, Take 1 capsule (1.5 mg total) by mouth daily., Disp: 90 capsule, Rfl: 0   cephALEXin (KEFLEX) 500 MG capsule, Take 1 capsule (500 mg total) by mouth 4 (four) times daily for 7 days., Disp: 28 capsule, Rfl: 0   clotrimazole-betamethasone (LOTRISONE) cream, Apply 1 Application topically 2 (two) times daily as needed (rash, itch burning). Affected area for up to 2 weeks, Disp: 30 g, Rfl: 0   escitalopram (LEXAPRO) 10 MG tablet, TAKE 1 TABLET(10 MG) BY MOUTH DAILY, Disp: 90 tablet, Rfl: 0   loratadine (CLARITIN) 10 MG tablet, Take 1 tablet (10 mg total) by mouth daily., Disp: 90 tablet, Rfl: 1  No Known Allergies  I personally reviewed active problem  list with the patient/caregiver today.   ROS  Ten systems reviewed and is negative except as mentioned in HPI   Objective  Vitals:   04/23/23 1317  BP: 112/66  Pulse: 80  Resp: 16  Temp: 98.1 F (36.7 C)  TempSrc: Oral  SpO2: 97%  Weight: 198 lb 1.6 oz (89.9 kg)  Height: 6\' 2"  (1.88 m)    Body mass index is 25.43 kg/m.  Physical Exam  Constitutional: Patient appears well-developed and well-nourished.  No distress.  HEENT: head atraumatic, normocephalic, pupils equal and reactive to light, ears normal TM, neck supple, throat within normal limits Cardiovascular: Normal rate, regular rhythm and normal heart sounds.  No murmur heard. No BLE  edema. Pulmonary/Chest: Effort normal and breath sounds normal. No respiratory distress. Abdominal: Soft.  There is no tenderness. Skin: 5th finger of right hand is immobilized going to see ortho today  Psychiatric: Patient has a normal mood and affect. behavior is normal. Judgment and thought content normal.    PHQ2/9:    04/23/2023    1:24 PM 03/20/2023   10:37 AM 03/11/2023    9:15 AM 02/05/2023    8:11 AM 01/10/2023    1:57 PM  Depression screen PHQ 2/9  Decreased Interest 3 3 0 2 3  Down, Depressed, Hopeless 3 3 0 1 3  PHQ - 2 Score 6 6 0 3 6  Altered sleeping 0 0 0 2 3  Tired, decreased energy 1 0 0 2 3  Change in appetite 1 0 0 2 2  Feeling bad or failure about yourself  3 3 0 3 3  Trouble concentrating 3 0 0 3 3  Moving slowly or fidgety/restless 2 0 0 1 3  Suicidal thoughts 3 1 0 3 3  PHQ-9 Score 19 10 0 19 26  Difficult doing work/chores Somewhat difficult  Not difficult at all Very difficult Very difficult    phq 9 is positive   Fall Risk:    04/23/2023    1:17 PM 03/20/2023   10:37 AM 03/11/2023    9:14 AM 02/05/2023    8:10 AM 01/10/2023    1:57 PM  Fall Risk   Falls in the past year? 0 0 0 0 0  Number falls in past yr:  0 0    Injury with Fall?  0 0    Risk for fall due to : No Fall Risks No Fall Risks No Fall Risks No Fall Risks No Fall Risks  Follow up Falls prevention discussed Falls prevention discussed Falls prevention discussed;Education provided;Falls evaluation completed Falls prevention discussed Falls prevention discussed      Functional Status Survey: Is the patient deaf or have difficulty hearing?: No Does the patient have difficulty seeing, even when wearing glasses/contacts?: No Does the patient have difficulty concentrating, remembering, or making decisions?: No Does the patient have difficulty walking or climbing stairs?: No Does the patient have difficulty dressing or bathing?: No Does the patient have difficulty doing errands alone such as  visiting a doctor's office or shopping?: No    Assessment & Plan  1. Major depression, recurrent, chronic (HCC)  - escitalopram (LEXAPRO) 10 MG tablet; TAKE 1 TABLET(10 MG) BY MOUTH DAILY  Dispense: 90 tablet; Refill: 0  2. GAD (generalized anxiety disorder)  - escitalopram (LEXAPRO) 10 MG tablet; TAKE 1 TABLET(10 MG) BY MOUTH DAILY  Dispense: 90 tablet; Refill: 0  3. Irritable bowel syndrome with diarrhea  Doing well since he finished Xifaxin, no longer taking elavil  4. Recent laceration of finger  Going to see Ortho   There are no diagnoses linked to this encounter.

## 2023-04-22 NOTE — Discharge Instructions (Addendum)
Sutures can be in place for the next 7 to 10 days. Wear splint until seen by Dr. Stephenie Acres. Please make an appointment with Dr. Stephenie Acres and communicate concern for flexor tendon injury. Please take Keflex 4 times daily for the next 7 days.

## 2023-04-22 NOTE — ED Notes (Signed)
See triage note. Pt to ED with deep laceration to finger. Not bleeding. Provider notified. Pt and family member arguing in room.

## 2023-04-22 NOTE — ED Triage Notes (Signed)
Patient to ED via POV for finger laceration. Patient cut right pinky finger with knife. Patient stating her can't feel finger. Bleeding controlled at this time.

## 2023-04-22 NOTE — ED Provider Notes (Signed)
[facility] Provider Note  Patient Contact: 7:02 PM (approximate)   History   Finger Injury   HPI  Raymond Yang is a 22 y.o. male presents to the emergency department with a 3 cm laceration to the volar aspect of the right hand that was sustained accidentally with a knife.  Patient states that his tetanus status is up-to-date.  Has had difficulty performing flexion at the right fifth digit since injury occurred.      Physical Exam   Triage Vital Signs: ED Triage Vitals [04/22/23 1718]  Enc Vitals Group     BP (!) 126/113     Pulse Rate 80     Resp 18     Temp 98.5 F (36.9 C)     Temp Source Oral     SpO2 100 %     Weight      Height      Head Circumference      Peak Flow      Pain Score 9     Pain Loc      Pain Edu?      Excl. in GC?     Most recent vital signs: Vitals:   04/22/23 1718  BP: (!) 126/113  Pulse: 80  Resp: 18  Temp: 98.5 F (36.9 C)  SpO2: 100%     General: Alert and in no acute distress. Eyes:  PERRL. EOMI. Head: No acute traumatic findings ENT:       Nose: No congestion/rhinnorhea.      Mouth/Throat: Mucous membranes are moist. Neck: No stridor. No cervical spine tenderness to palpation. Cardiovascular:  Good peripheral perfusion Respiratory: Normal respiratory effort without tachypnea or retractions. Lungs CTAB. Good air entry to the bases with no decreased or absent breath sounds. Gastrointestinal: Bowel sounds 4 quadrants. Soft and nontender to palpation. No guarding or rigidity. No palpable masses. No distention. No CVA tenderness. Musculoskeletal: Patient cannot perform flexion at the right fifth digit.  Patient has 3 cm laceration along the volar aspect of the right hand.  Palpable radial and ulnar pulses, right.  Capillary refill less than 2 seconds on the right. Neurologic:  No gross focal neurologic deficits are appreciated.  Skin:   No rash noted Other:   ED Results / Procedures / Treatments    Labs (all labs ordered are listed, but only abnormal results are displayed) Labs Reviewed - No data to display     RADIOLOGY  I personally viewed and evaluated these images as part of my medical decision making, as well as reviewing the written report by the radiologist.  ED Provider Interpretation: No acute abnormality on x-ray of the right fifth digit.   PROCEDURES:  Critical Care performed: No  ..Laceration Repair  Date/Time: 04/22/2023 7:04 PM  Performed by: Orvil Feil, PA-C Authorized by: Orvil Feil, PA-C   Consent:    Consent obtained:  Verbal   Risks discussed:  Infection and pain Universal protocol:    Procedure explained and questions answered to patient or proxy's satisfaction: yes     Patient identity confirmed:  Verbally with patient Anesthesia:    Anesthesia method:  Local infiltration   Local anesthetic:  Lidocaine 1% w/o epi Laceration details:    Location:  Finger   Finger location:  R small finger   Length (cm):  3   Depth (mm):  5 Pre-procedure details:    Preparation:  Patient was prepped and draped in usual sterile fashion Exploration:  Limited defect created (wound extended): no     Imaging obtained: x-ray     Imaging outcome: foreign body not noted     Wound exploration: wound explored through full range of motion     Contaminated: no   Treatment:    Area cleansed with:  Povidone-iodine   Amount of cleaning:  Extensive   Irrigation solution:  Sterile saline   Irrigation volume:  500   Visualized foreign bodies/material removed: no     Undermining:  None Skin repair:    Repair method:  Sutures   Suture size:  4-0   Suture technique:  Running locked   Number of sutures:  10 Approximation:    Approximation:  Close Repair type:    Repair type:  Intermediate Post-procedure details:    Dressing:  Splint for protection   Procedure completion:  Tolerated well, no immediate complications    MEDICATIONS ORDERED IN  ED: Medications  lidocaine (XYLOCAINE) 1 % (with pres) injection 10 mL (has no administration in time range)  oxyCODONE-acetaminophen (PERCOCET/ROXICET) 5-325 MG per tablet 1 tablet (1 tablet Oral Given 04/22/23 1736)  ondansetron (ZOFRAN-ODT) disintegrating tablet 4 mg (4 mg Oral Given 04/22/23 1736)     IMPRESSION / MDM / ASSESSMENT AND PLAN / ED COURSE  I reviewed the triage vital signs and the nursing notes.                             Assessment and plan Hand laceration 22 year old male presents to the emergency department with a 3 cm laceration along the base of the right fifth digit.  Vital signs reassuring at triage.  On exam, patient had difficulty performing flexion at the metacarpal phalangeal joint where laceration occurred, increasing suspicion for flexor tendon injury.  Patient's wound was copiously irrigated with saline and Betadine and laceration repair occurred without complication.  Patient's digit was splinted into extension for protection and patient was discharged with Keflex.  His tetanus status is up-to-date.  Tylenol was recommended for discomfort.  Return precautions were given to return with new or worsening symptoms.     FINAL CLINICAL IMPRESSION(S) / ED DIAGNOSES   Final diagnoses:  Laceration of right little finger without foreign body without damage to nail, initial encounter  Injury of flexor tendon of right hand, initial encounter     Rx / DC Orders   ED Discharge Orders          Ordered    cephALEXin (KEFLEX) 500 MG capsule  4 times daily        04/22/23 1831             Note:  This document was prepared using Dragon voice recognition software and may include unintentional dictation errors.   Pia Mau [place], PA-C 04/22/23 1906    Minna Antis, MD 04/22/23 2144

## 2023-04-23 ENCOUNTER — Ambulatory Visit (INDEPENDENT_AMBULATORY_CARE_PROVIDER_SITE_OTHER): Payer: Medicaid Other | Admitting: Family Medicine

## 2023-04-23 ENCOUNTER — Encounter: Payer: Self-pay | Admitting: Family Medicine

## 2023-04-23 VITALS — BP 112/66 | HR 80 | Temp 98.1°F | Resp 16 | Ht 74.0 in | Wt 198.1 lb

## 2023-04-23 DIAGNOSIS — F339 Major depressive disorder, recurrent, unspecified: Secondary | ICD-10-CM | POA: Diagnosis not present

## 2023-04-23 DIAGNOSIS — K580 Irritable bowel syndrome with diarrhea: Secondary | ICD-10-CM

## 2023-04-23 DIAGNOSIS — S61219A Laceration without foreign body of unspecified finger without damage to nail, initial encounter: Secondary | ICD-10-CM | POA: Diagnosis not present

## 2023-04-23 DIAGNOSIS — F411 Generalized anxiety disorder: Secondary | ICD-10-CM | POA: Diagnosis not present

## 2023-04-23 DIAGNOSIS — S61216A Laceration without foreign body of right little finger without damage to nail, initial encounter: Secondary | ICD-10-CM | POA: Diagnosis not present

## 2023-04-23 MED ORDER — ESCITALOPRAM OXALATE 10 MG PO TABS: ORAL_TABLET | ORAL | 0 refills | Status: DC

## 2023-05-02 DIAGNOSIS — S66126A Laceration of flexor muscle, fascia and tendon of right little finger at wrist and hand level, initial encounter: Secondary | ICD-10-CM | POA: Diagnosis not present

## 2023-05-02 DIAGNOSIS — S64496A Injury of digital nerve of right little finger, initial encounter: Secondary | ICD-10-CM | POA: Diagnosis not present

## 2023-05-05 DIAGNOSIS — S61219D Laceration without foreign body of unspecified finger without damage to nail, subsequent encounter: Secondary | ICD-10-CM | POA: Diagnosis not present

## 2023-05-15 DIAGNOSIS — S61219D Laceration without foreign body of unspecified finger without damage to nail, subsequent encounter: Secondary | ICD-10-CM | POA: Diagnosis not present

## 2023-05-21 DIAGNOSIS — S61219D Laceration without foreign body of unspecified finger without damage to nail, subsequent encounter: Secondary | ICD-10-CM | POA: Diagnosis not present

## 2023-05-26 DIAGNOSIS — S61219D Laceration without foreign body of unspecified finger without damage to nail, subsequent encounter: Secondary | ICD-10-CM | POA: Diagnosis not present

## 2023-05-30 ENCOUNTER — Ambulatory Visit: Payer: Medicaid Other | Admitting: Family Medicine

## 2023-06-09 DIAGNOSIS — S61219D Laceration without foreign body of unspecified finger without damage to nail, subsequent encounter: Secondary | ICD-10-CM | POA: Diagnosis not present

## 2023-06-11 ENCOUNTER — Other Ambulatory Visit: Payer: Self-pay | Admitting: Family Medicine

## 2023-06-11 DIAGNOSIS — K580 Irritable bowel syndrome with diarrhea: Secondary | ICD-10-CM

## 2023-06-17 DIAGNOSIS — S61219D Laceration without foreign body of unspecified finger without damage to nail, subsequent encounter: Secondary | ICD-10-CM | POA: Diagnosis not present

## 2023-06-26 DIAGNOSIS — S61219D Laceration without foreign body of unspecified finger without damage to nail, subsequent encounter: Secondary | ICD-10-CM | POA: Diagnosis not present

## 2023-07-15 DIAGNOSIS — S61219D Laceration without foreign body of unspecified finger without damage to nail, subsequent encounter: Secondary | ICD-10-CM | POA: Diagnosis not present

## 2023-07-28 ENCOUNTER — Other Ambulatory Visit: Payer: Self-pay | Admitting: Family Medicine

## 2023-07-28 DIAGNOSIS — F339 Major depressive disorder, recurrent, unspecified: Secondary | ICD-10-CM

## 2023-07-28 DIAGNOSIS — F411 Generalized anxiety disorder: Secondary | ICD-10-CM

## 2023-08-19 NOTE — Progress Notes (Signed)
Name: Raymond Yang   MRN: [id number]    DOB: [birth year]   Date:08/20/2023       Progress Note  Subjective  Chief Complaint  Follow Up  HPI  GAD/MDD: he has a long history of depression and anxiety, he took prozac  years ago, he was also diagnosed with ADD as a child but has been off medications for years. He has seen therapists in the past.  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 He was very depressed with phq 9 of 18, he is now taking Lexapro and is feeling better, he is not currently taking Vraylar - he states he ran out of medication. He is dating and trying to move out soon.    IBS: he was having  multiple episodes of watery stools about 5 per day , associated with abdominal cramping and sometimes inability to finish his bowel movements. Elavil helped but since he took  Xifaxin Feb 2024 symptoms resolved and he stopped elavil , he states over the past week he has noticed increase in bowel movements, about 3 per day and only in the morning . We will send another round of Xifaxin to pharmacy   Patient Active Problem List   Diagnosis Date Noted   Irritable bowel syndrome with diarrhea 12/20/2022   GAD (generalized anxiety disorder) 12/20/2022   Major depression, recurrent, chronic (HCC) 11/15/2022   Marijuana use 08/07/2017   GERD (gastroesophageal reflux disease) 06/20/2017   ADD (attention deficit disorder) 05/26/2015   Insomnia, persistent 05/26/2015   Oppositional defiant disorder 05/26/2015   Seasonal allergic rhinitis 05/26/2015   History of seizure 05/26/2015    Past Surgical History:  Procedure Laterality Date   ORIF WRIST FRACTURE Right 09/30/2018   Procedure: OPEN REDUCTION INTERNAL FIXATION (ORIF) RIGHT SCAPHOID FRACTURE;  Surgeon: Christena Flake, MD;  Location: [facility];  Service: Orthopedics;  Laterality: Right;  ZIMMER HERBERT SCREW  SYSTEM  FLUROSCAN   TYMPANOSTOMY TUBE PLACEMENT     as infant    Family History  Problem Relation Age of Onset   Migraines Mother    Anxiety disorder Mother    Obesity Mother    Sleep apnea Father    Hypertension Father    Diabetes Father    Heart disease Maternal Grandmother     Social History   Tobacco Use   Smoking status: Never   Smokeless tobacco: Never  Substance Use Topics   Alcohol use: No    Alcohol/week: 0.0 standard drinks of alcohol     Current Outpatient Medications:    cariprazine (VRAYLAR) 1.5 MG capsule, Take 1 capsule (1.5 mg total) by mouth daily., Disp: 90 capsule, Rfl: 0   clotrimazole-betamethasone (LOTRISONE) cream, Apply 1 Application topically 2 (two) times daily as needed (rash, itch burning). Affected area for up to 2 weeks, Disp: 30 g, Rfl: 0   escitalopram (LEXAPRO) 10 MG tablet, TAKE 1 TABLET(10 MG) BY MOUTH DAILY, Disp: 90 tablet, Rfl: 0   loratadine (CLARITIN) 10 MG tablet, Take 1 tablet (10 mg total) by mouth daily., Disp: 90 tablet, Rfl: 1  No Known Allergies  I personally reviewed active problem list, medication list, allergies, family history, social history, health maintenance with the patient/caregiver today.   ROS  Ten systems reviewed and is negative except as mentioned in HPI    Objective  Vitals:   08/20/23 1408  BP: 112/68  Pulse: 69  Resp: 14  Temp: 98.4 F (36.9 C)  TempSrc: Oral  SpO2: 96%  Weight: 203 lb 8 oz (92.3 kg)  Height: 6\' 2"  (1.88 m)    Body mass index is 26.13 kg/m.  Physical Exam  Constitutional: Patient appears well-developed and well-nourished.  No distress.  HEENT: head atraumatic, normocephalic, pupils equal and reactive to light, neck supple, throat within normal limits Cardiovascular: Normal rate, regular rhythm and normal heart sounds.  No murmur heard. No BLE edema. Pulmonary/Chest: Effort normal and breath sounds normal. No respiratory distress. Abdominal: Soft.  There is no  tenderness. Psychiatric: Patient has a normal mood and affect. behavior is normal. Judgment and thought content normal.   PHQ2/9:    08/20/2023    2:18 PM 04/23/2023    1:24 PM 03/20/2023   10:37 AM 03/11/2023    9:15 AM 02/05/2023    8:11 AM  Depression screen PHQ 2/9  Decreased Interest 1 3 3  0 2  Down, Depressed, Hopeless 1 3 3  0 1  PHQ - 2 Score 2 6 6  0 3  Altered sleeping 1 0 0 0 2  Tired, decreased energy 1 1 0 0 2  Change in appetite 2 1 0 0 2  Feeling bad or failure about yourself  1 3 3  0 3  Trouble concentrating 2 3 0 0 3  Moving slowly or fidgety/restless 1 2 0 0 1  Suicidal thoughts 2 3 1  0 3  PHQ-9 Score 12 19 10  0 19  Difficult doing work/chores Somewhat difficult Somewhat difficult  Not difficult at all Very difficult    phq 9 is positive   Fall Risk:    08/20/2023    2:08 PM 04/23/2023    1:17 PM 03/20/2023   10:37 AM 03/11/2023    9:14 AM 02/05/2023    8:10 AM  Fall Risk   Falls in the past year? 0 0 0 0 0  Number falls in past yr:   0 0   Injury with Fall?   0 0   Risk for fall due to : No Fall Risks No Fall Risks No Fall Risks No Fall Risks No Fall Risks  Follow up Falls prevention discussed Falls prevention discussed Falls prevention discussed Falls prevention discussed;Education provided;Falls evaluation completed Falls prevention discussed      Functional Status Survey: Is the patient deaf or have difficulty hearing?: No Does the patient have difficulty seeing, even when wearing glasses/contacts?: No Does the patient have difficulty concentrating, remembering, or making decisions?: No Does the patient have difficulty walking or climbing stairs?: No Does the patient have difficulty dressing or bathing?: No Does the patient have difficulty doing errands alone such as visiting a doctor's office or shopping?: No    Assessment & Plan  1. Major depression, recurrent, chronic (HCC)  - Ambulatory referral to Psychiatry - cariprazine (VRAYLAR) 1.5 MG capsule;  Take 1 capsule (1.5 mg total) by mouth daily.  Dispense: 90 capsule; Refill: 0 - escitalopram (LEXAPRO) 10 MG tablet; TAKE 1 TABLET(10 MG) BY MOUTH DAILY  Dispense: 90 tablet; Refill: 0  2. GAD (generalized anxiety disorder)  - Ambulatory referral to Psychiatry - escitalopram (LEXAPRO) 10 MG tablet; TAKE 1 TABLET(10 MG) BY MOUTH DAILY  Dispense: 90 tablet; Refill: 0  3. Irritable bowel syndrome with diarrhea  - rifaximin (XIFAXAN) 550 MG TABS tablet; Take 1 tablet (550 mg total) by mouth 3 (three) times daily.  Dispense: 42 tablet; Refill: 0

## 2023-08-20 ENCOUNTER — Encounter: Payer: Self-pay | Admitting: Family Medicine

## 2023-08-20 ENCOUNTER — Ambulatory Visit (INDEPENDENT_AMBULATORY_CARE_PROVIDER_SITE_OTHER): Payer: 59 | Admitting: Family Medicine

## 2023-08-20 VITALS — BP 112/68 | HR 69 | Temp 98.4°F | Resp 14 | Ht 74.0 in | Wt 203.5 lb

## 2023-08-20 DIAGNOSIS — F411 Generalized anxiety disorder: Secondary | ICD-10-CM | POA: Diagnosis not present

## 2023-08-20 DIAGNOSIS — K580 Irritable bowel syndrome with diarrhea: Secondary | ICD-10-CM

## 2023-08-20 DIAGNOSIS — F339 Major depressive disorder, recurrent, unspecified: Secondary | ICD-10-CM

## 2023-08-20 MED ORDER — ESCITALOPRAM OXALATE 10 MG PO TABS: ORAL_TABLET | ORAL | 0 refills | Status: DC

## 2023-08-20 MED ORDER — RIFAXIMIN 550 MG PO TABS: 550.0000 mg | ORAL_TABLET | Freq: Three times a day (TID) | ORAL | 0 refills | Status: DC

## 2023-08-20 MED ORDER — CARIPRAZINE HCL 1.5 MG PO CAPS: 1.5000 mg | ORAL_CAPSULE | Freq: Every day | ORAL | 0 refills | Status: DC

## 2023-08-29 ENCOUNTER — Encounter: Payer: Self-pay | Admitting: Family Medicine

## 2023-09-03 DIAGNOSIS — S61219D Laceration without foreign body of unspecified finger without damage to nail, subsequent encounter: Secondary | ICD-10-CM | POA: Diagnosis not present

## 2023-09-23 ENCOUNTER — Other Ambulatory Visit: Payer: Self-pay

## 2023-09-23 DIAGNOSIS — F339 Major depressive disorder, recurrent, unspecified: Secondary | ICD-10-CM

## 2023-09-23 DIAGNOSIS — F411 Generalized anxiety disorder: Secondary | ICD-10-CM

## 2023-10-20 NOTE — Progress Notes (Signed)
Name: Raymond Yang   MRN: [id number]    DOB: [birth year]   Date:10/21/2023       Progress Note  Subjective  Chief Complaint  Annual Exam  HPI  Patient presents for annual CPE.  Diet: he eats fast food/[employer] hots dogs due to cost, spaghetti, Roman needles  Exercise: not on his job  Last Dental Exam: due for an exam  Last Eye Exam: up to date   Depression: phq 9 is positive    10/21/2023    2:47 PM 08/20/2023    2:18 PM 04/23/2023    1:24 PM 03/20/2023   10:37 AM 03/11/2023    9:15 AM  Depression screen PHQ 2/9  Decreased Interest 3 1 3 3  0  Down, Depressed, Hopeless 3 1 3 3  0  PHQ - 2 Score 6 2 6 6  0  Altered sleeping 2 1 0 0 0  Tired, decreased energy 2 1 1  0 0  Change in appetite 1 2 1  0 0  Feeling bad or failure about yourself  3 1 3 3  0  Trouble concentrating 3 2 3  0 0  Moving slowly or fidgety/restless 1 1 2  0 0  Suicidal thoughts 3 2 3 1  0  PHQ-9 Score 21 12 19 10  0  Difficult doing work/chores Not difficult at all Somewhat difficult Somewhat difficult  Not difficult at all    Hypertension:  BP Readings from Last 3 Encounters:  10/21/23 118/80  08/20/23 112/68  04/23/23 112/66    Obesity: Wt Readings from Last 3 Encounters:  10/21/23 209 lb 11.2 oz (95.1 kg)  08/20/23 203 lb 8 oz (92.3 kg)  04/23/23 198 lb 1.6 oz (89.9 kg)   BMI Readings from Last 3 Encounters:  10/21/23 26.92 kg/m  08/20/23 26.13 kg/m  04/23/23 25.43 kg/m     Glucose:  Glucose, Bld  Date Value Ref Range Status  02/10/2023 96 70 - 99 mg/dL Final    Comment:    Glucose reference range applies only to samples taken after fasting for at least 8 hours.  11/15/2022 82 65 - 99 mg/dL Final    Comment:    .            Fasting reference interval .   11/23/2018 76 65 - 99 mg/dL Final    Comment:    .            Fasting reference interval .     Flowsheet Row Office Visit from 03/20/2023 in [facility]  AUDIT-C Score 8       Single STD testing and  prevention (HIV/chl/gon/syphilis): 03/11/23 Sexual history:  not currently sexually active  Hep C Screening: 08/06/21 Skin cancer: Discussed monitoring for atypical lesions Colorectal cancer: N/A Prostate cancer:  N/A   Lung cancer:  Low Dose CT Chest recommended if Age 41-80 years, 30 pack-year currently smoking OR have quit w/in 15years. Patient is not a candidate  AAA: The USPSTF recommends one-time screening with ultrasonography in men ages 61 to 24 years who have ever smoked. Not a candidate   Vaccines:   HPV: 1 dose, 2nd dose today  Tdap: up to date Shingrix: N/A Pneumonia: up to date Flu: today  COVID-19: N/A  Advanced Care Planning: A voluntary discussion about advance care planning including the explanation and discussion of advance directives.  Discussed health care proxy and Living will, and the patient was able to identify a health care proxy as parents .  Patient does not  have a living will and power of attorney of health care   Patient Active Problem List   Diagnosis Date Noted   Irritable bowel syndrome with diarrhea 12/20/2022   GAD (generalized anxiety disorder) 12/20/2022   Major depression, recurrent, chronic (HCC) 11/15/2022   Marijuana use 08/07/2017   GERD (gastroesophageal reflux disease) 06/20/2017   ADD (attention deficit disorder) 05/26/2015   Insomnia, persistent 05/26/2015   Oppositional defiant disorder 05/26/2015   Seasonal allergic rhinitis 05/26/2015   History of seizure 05/26/2015    Past Surgical History:  Procedure Laterality Date   ORIF WRIST FRACTURE Right 09/30/2018   Procedure: OPEN REDUCTION INTERNAL FIXATION (ORIF) RIGHT SCAPHOID FRACTURE;  Surgeon: Christena Flake, MD;  Location: [facility];  Service: Orthopedics;  Laterality: Right;  ZIMMER HERBERT SCREW SYSTEM  FLUROSCAN   TYMPANOSTOMY TUBE PLACEMENT     as infant    Family History  Problem Relation Age of Onset   Migraines Mother    Anxiety disorder Mother    Obesity  Mother    Sleep apnea Father    Hypertension Father    Diabetes Father    Heart disease Maternal Grandmother     Social History   Socioeconomic History   Marital status: Single    Spouse name: Not on file   Number of children: 0   Years of education: Not on file   Highest education level: 12th grade  Occupational History   Occupation: Personnel officer  Tobacco Use   Smoking status: Never   Smokeless tobacco: Never  Vaping Use   Vaping status: Every Day   Start date: 02/08/2018   Substances: Nicotine   Devices: Geek[employer], pulse  Substance and Sexual Activity   Alcohol use: No    Alcohol/week: 0.0 standard drinks of alcohol   Drug use: Yes    Frequency: 3.0 times per week    Types: Marijuana   Sexual activity: Yes    Partners: Female    Birth control/protection: Condom    Comment: multiple male sexual partners in the last 6 m 02/2023  Other Topics Concern   Not on file  Social History Narrative   Not on file   Social Determinants of Health   Financial Resource Strain: Medium Risk (03/20/2023)   Overall Financial Resource Strain (CARDIA)    Difficulty of Paying Living Expenses: Somewhat hard  Food Insecurity: Food Insecurity Present (03/20/2023)   Hunger Vital Sign    Worried About Running Out of Food in the Last Year: Often true    Ran Out of Food in the Last Year: Often true  Transportation Needs: No Transportation Needs (03/20/2023)   PRAPARE - Administrator, Civil Service (Medical): No    Lack of Transportation (Non-Medical): No  Physical Activity: Inactive (03/20/2023)   Exercise Vital Sign    Days of Exercise per Week: 0 days    Minutes of Exercise per Session: 0 min  Stress: Stress Concern Present (03/20/2023)   [employer] of Occupational Health - Occupational Stress Questionnaire    Feeling of Stress : Very much  Social Connections: Unknown (03/20/2023)   Social Connection and Isolation Panel [NHANES]    Frequency of Communication with Friends and  Family: Twice a week    Frequency of Social Gatherings with Friends and Family: Twice a week    Attends Religious Services: Patient declined    Database administrator or Organizations: No    Attends Banker Meetings: Never    Marital Status:  Never married  Intimate Partner Violence: Not At Risk (11/15/2022)   Humiliation, Afraid, Rape, and Kick questionnaire    Fear of Current or Ex-Partner: No    Emotionally Abused: No    Physically Abused: No    Sexually Abused: No     Current Outpatient Medications:    cariprazine (VRAYLAR) 1.5 MG capsule, Take 1 capsule (1.5 mg total) by mouth daily., Disp: 90 capsule, Rfl: 0   clotrimazole-betamethasone (LOTRISONE) cream, Apply 1 Application topically 2 (two) times daily as needed (rash, itch burning). Affected area for up to 2 weeks, Disp: 30 g, Rfl: 0   escitalopram (LEXAPRO) 10 MG tablet, TAKE 1 TABLET(10 MG) BY MOUTH DAILY, Disp: 90 tablet, Rfl: 0   loratadine (CLARITIN) 10 MG tablet, Take 1 tablet (10 mg total) by mouth daily. (Patient not taking: Reported on 10/21/2023), Disp: 90 tablet, Rfl: 1  No Known Allergies   ROS  Ten systems reviewed and is negative except as mentioned in HPI     Objective  Vitals:   10/21/23 1445  BP: 118/80  Pulse: 88  Resp: 18  Temp: 98.3 F (36.8 C)  TempSrc: Oral  SpO2: 95%  Weight: 209 lb 11.2 oz (95.1 kg)  Height: 6\' 2"  (1.88 m)    Body mass index is 26.92 kg/m.  Physical Exam  Constitutional: Patient appears well-developed and well-nourished. No distress.  HENT: Head: Normocephalic and atraumatic. Ears: B TMs ok, no erythema or effusion; Nose: Nose normal. Mouth/Throat: Oropharynx is clear and moist. No oropharyngeal exudate.  Eyes: Conjunctivae and EOM are normal. Pupils are equal, round, and reactive to light. No scleral icterus.  Neck: Normal range of motion. Neck supple. No JVD present. No thyromegaly present.  Cardiovascular: Normal rate, regular rhythm and normal heart  sounds.  No murmur heard. No BLE edema. Pulmonary/Chest: Effort normal and breath sounds normal. No respiratory distress. Abdominal: Soft. Bowel sounds are normal, no distension. There is no tenderness. no masses MALE GENITALIA: Normal descended testes bilaterally, no masses palpated, no hernias, no lesions, no discharge. Small cyst on dorsal penile shaft, per patient stable for a long time RECTAL: not done  Musculoskeletal: Normal range of motion, no joint effusions. No gross deformities Neurological: he is alert and oriented to person, place, and time. No cranial nerve deficit. Coordination, balance, strength, speech and gait are normal.  Skin: Skin is warm and dry. No rash noted. No erythema.  Psychiatric: Patient has a normal mood and affect. behavior is normal. Judgment and thought content normal.   Fall Risk:    10/21/2023    2:46 PM 08/20/2023    2:08 PM 04/23/2023    1:17 PM 03/20/2023   10:37 AM 03/11/2023    9:14 AM  Fall Risk   Falls in the past year? 0 0 0 0 0  Number falls in past yr:    0 0  Injury with Fall?    0 0  Risk for fall due to : No Fall Risks No Fall Risks No Fall Risks No Fall Risks No Fall Risks  Follow up Falls prevention discussed Falls prevention discussed Falls prevention discussed Falls prevention discussed Falls prevention discussed;Education provided;Falls evaluation completed     Assessment & Plan  1. Well adult exam  - Flu vaccine trivalent PF, 6mos and older(Flulaval,Afluria,Fluarix,Fluzone) - Lipid panel - CBC with Differential/Platelet - COMPLETE METABOLIC PANEL WITH GFR - Hemoglobin A1c - HIV Antibody (routine testing w rflx) - RPR - Urine cytology ancillary only - TSH  2.  Need for immunization against influenza  - Flu vaccine trivalent PF, 6mos and older(Flulaval,Afluria,Fluarix,Fluzone)  3. Screening examination for STD (sexually transmitted disease)  - HIV Antibody (routine testing w rflx) - RPR - Urine cytology ancillary only  4.  Long-term use of high-risk medication   5. Weight gain  - COMPLETE METABOLIC PANEL WITH GFR - Hemoglobin A1c - TSH  6. Screening for deficiency anemia  - CBC with Differential/Platelet  7. Screening cholesterol level  - Lipid panel  8. Diabetes mellitus screening  - Hemoglobin A1c     -Prostate cancer screening and PSA options (with potential risks and benefits of testing vs not testing) were discussed along with recent recs/guidelines. -USPSTF grade A and B recommendations reviewed with patient; age-appropriate recommendations, preventive care, screening tests, etc discussed and encouraged; healthy living encouraged; see AVS for patient education given to patient -Discussed importance of 150 minutes of physical activity weekly, eat two servings of fish weekly, eat one serving of tree nuts ( cashews, pistachios, pecans, almonds.Marland Kitchen) every other day, eat 6 servings of fruit/vegetables daily and drink plenty of water and avoid sweet beverages.  -Reviewed Health Maintenance: yes

## 2023-10-21 ENCOUNTER — Encounter: Payer: Self-pay | Admitting: Family Medicine

## 2023-10-21 ENCOUNTER — Ambulatory Visit (INDEPENDENT_AMBULATORY_CARE_PROVIDER_SITE_OTHER): Payer: 59 | Admitting: Family Medicine

## 2023-10-21 ENCOUNTER — Other Ambulatory Visit: Payer: Self-pay

## 2023-10-21 ENCOUNTER — Other Ambulatory Visit (HOSPITAL_COMMUNITY)
Admission: RE | Admit: 2023-10-21 | Discharge: 2023-10-21 | Disposition: A | Discharge status: Home / Self Care | Payer: 59 | Source: Ambulatory Visit | Attending: Family Medicine | Admitting: Family Medicine

## 2023-10-21 VITALS — BP 118/80 | HR 88 | Temp 98.3°F | Resp 18 | Ht 74.0 in | Wt 209.7 lb

## 2023-10-21 DIAGNOSIS — Z0000 Encounter for general adult medical examination without abnormal findings: Secondary | ICD-10-CM

## 2023-10-21 DIAGNOSIS — Z130 Encounter for screening for diseases of the blood and blood-forming organs and certain disorders involving the immune mechanism: Secondary | ICD-10-CM | POA: Diagnosis not present

## 2023-10-21 DIAGNOSIS — Z23 Encounter for immunization: Secondary | ICD-10-CM | POA: Diagnosis not present

## 2023-10-21 DIAGNOSIS — Z131 Encounter for screening for diabetes mellitus: Secondary | ICD-10-CM

## 2023-10-21 DIAGNOSIS — Z113 Encounter for screening for infections with a predominantly sexual mode of transmission: Secondary | ICD-10-CM | POA: Insufficient documentation

## 2023-10-21 DIAGNOSIS — Z79899 Other long term (current) drug therapy: Secondary | ICD-10-CM

## 2023-10-21 DIAGNOSIS — Z13220 Encounter for screening for lipoid disorders: Secondary | ICD-10-CM | POA: Diagnosis not present

## 2023-10-21 DIAGNOSIS — R635 Abnormal weight gain: Secondary | ICD-10-CM | POA: Diagnosis not present

## 2023-10-21 DIAGNOSIS — J302 Other seasonal allergic rhinitis: Secondary | ICD-10-CM

## 2023-10-21 DIAGNOSIS — Z0001 Encounter for general adult medical examination with abnormal findings: Secondary | ICD-10-CM | POA: Diagnosis not present

## 2023-10-21 MED ORDER — LORATADINE 10 MG PO TABS: 10.0000 mg | ORAL_TABLET | Freq: Every day | ORAL | 1 refills | Status: DC

## 2023-10-22 LAB — CBC WITH DIFFERENTIAL/PLATELET
Absolute Lymphocytes: 1609 {cells}/uL (ref 850–3900)
Absolute Monocytes: 651 {cells}/uL (ref 200–950)
Basophils Absolute: 47 {cells}/uL (ref 0–200)
Basophils Relative: 0.5 %
Eosinophils Absolute: 121 {cells}/uL (ref 15–500)
Eosinophils Relative: 1.3 %
HCT: 47.1 % (ref 38.5–50.0)
Hemoglobin: 15.6 g/dL (ref 13.2–17.1)
MCH: 27.9 pg (ref 27.0–33.0)
MCHC: 33.1 g/dL (ref 32.0–36.0)
MCV: 84.1 fL (ref 80.0–100.0)
MPV: 11.3 fL (ref 7.5–12.5)
Monocytes Relative: 7 %
Neutro Abs: 6873 {cells}/uL (ref 1500–7800)
Neutrophils Relative %: 73.9 %
Platelets: 194 10*3/uL (ref 140–400)
RBC: 5.6 10*6/uL (ref 4.20–5.80)
RDW: 13.3 % (ref 11.0–15.0)
Total Lymphocyte: 17.3 %
WBC: 9.3 10*3/uL (ref 3.8–10.8)

## 2023-10-22 LAB — LIPID PANEL
Cholesterol: 171 mg/dL (ref <200)
HDL: 49 mg/dL (ref >=40)
LDL Cholesterol (Calc): 99 mg/dL
Non-HDL Cholesterol (Calc): 122 mg/dL (ref <130)
Total CHOL/HDL Ratio: 3.5 (calc) (ref <5.0)
Triglycerides: 132 mg/dL (ref <150)

## 2023-10-22 LAB — COMPLETE METABOLIC PANEL WITH GFR
AG Ratio: 1.6 (calc) (ref 1.0–2.5)
ALT: 33 U/L (ref 9–46)
AST: 24 U/L (ref 10–40)
Albumin: 4.4 g/dL (ref 3.6–5.1)
Alkaline phosphatase (APISO): 76 U/L (ref 36–130)
BUN: 21 mg/dL (ref 7–25)
CO2: 27 mmol/L (ref 20–32)
Calcium: 9.8 mg/dL (ref 8.6–10.3)
Chloride: 105 mmol/L (ref 98–110)
Creat: 0.94 mg/dL (ref 0.60–1.24)
Globulin: 2.8 g/dL (ref 1.9–3.7)
Glucose, Bld: 88 mg/dL (ref 65–99)
Potassium: 3.8 mmol/L (ref 3.5–5.3)
Sodium: 141 mmol/L (ref 135–146)
Total Bilirubin: 0.8 mg/dL (ref 0.2–1.2)
Total Protein: 7.2 g/dL (ref 6.1–8.1)
eGFR: 118 mL/min/{1.73_m2} (ref >=60)

## 2023-10-22 LAB — HEMOGLOBIN A1C
Hgb A1c MFr Bld: 5.2 %{Hb} (ref <5.7)
Mean Plasma Glucose: 103 mg/dL
eAG (mmol/L): 5.7 mmol/L

## 2023-10-22 LAB — HIV ANTIBODY (ROUTINE TESTING W REFLEX): HIV 1&2 Ab, 4th Generation: NONREACTIVE

## 2023-10-22 LAB — RPR: RPR Ser Ql: NONREACTIVE

## 2023-10-22 LAB — TSH: TSH: 1.69 m[IU]/L (ref 0.40–4.50)

## 2023-10-23 LAB — URINE CYTOLOGY ANCILLARY ONLY
Chlamydia: NEGATIVE
Comment: NEGATIVE
Comment: NORMAL
Neisseria Gonorrhea: NEGATIVE

## 2023-10-30 ENCOUNTER — Other Ambulatory Visit: Payer: Self-pay | Admitting: Family Medicine

## 2023-10-30 DIAGNOSIS — S61219D Laceration without foreign body of unspecified finger without damage to nail, subsequent encounter: Secondary | ICD-10-CM | POA: Diagnosis not present

## 2023-10-30 DIAGNOSIS — F411 Generalized anxiety disorder: Secondary | ICD-10-CM

## 2023-10-30 DIAGNOSIS — F339 Major depressive disorder, recurrent, unspecified: Secondary | ICD-10-CM

## 2023-11-12 NOTE — Progress Notes (Signed)
Name: Raymond Yang   MRN: [id number]    DOB: 15-May-2001   Date:11/19/2023       Progress Note  Subjective  Chief Complaint  Chief Complaint  Patient presents with   Medical Management of Chronic Issues    HPI  Discussed the use of AI scribe software for clinical note transcription with the patient, who gave verbal consent to proceed.  History of Present Illness   The patient, with a history of recurrent and chronic major depressive episodes, generalized anxiety disorder, and irritable bowel syndrome, presents for a regular follow-up. He reports an overall improvement in his condition, attributing it to recent positive changes in his life, including securing a new job and moving out to live independently.  However, he continues to struggle with feelings of self-deprecation and occasional suicidal thoughts, indicating that his depression is not yet in remission. He also reports issues with concentration, which could be attributed to either his depression or a history of attention deficit disorder (ADD).  Regarding his medication regimen, the patient is currently only taking Lexapro, having stopped Vraylar due to forgetfulness rather than any adverse effects or financial constraints.  In addition to his mental health concerns, the patient also experiences seasonal allergies, particularly in response to dust exposure at his workplace. He has been prescribed Claritin but did not get rx filled yet   He has also recently experienced a change in his insurance plan, the details of which are yet to be clarified.         Patient Active Problem List   Diagnosis Date Noted   Irritable bowel syndrome with diarrhea 12/20/2022   GAD (generalized anxiety disorder) 12/20/2022   Major depression, recurrent, chronic (HCC) 11/15/2022   Marijuana use 08/07/2017   GERD (gastroesophageal reflux disease) 06/20/2017   ADD (attention deficit disorder) 05/26/2015   Insomnia, persistent 05/26/2015    Oppositional defiant disorder 05/26/2015   Seasonal allergic rhinitis 05/26/2015   History of seizure 05/26/2015    Past Surgical History:  Procedure Laterality Date   ORIF WRIST FRACTURE Right 09/30/2018   Procedure: OPEN REDUCTION INTERNAL FIXATION (ORIF) RIGHT SCAPHOID FRACTURE;  Surgeon: Christena Flake, MD;  Location: [facility];  Service: Orthopedics;  Laterality: Right;  ZIMMER HERBERT SCREW SYSTEM  FLUROSCAN   TYMPANOSTOMY TUBE PLACEMENT     as infant    Family History  Problem Relation Age of Onset   Migraines Mother    Anxiety disorder Mother    Obesity Mother    Sleep apnea Father    Hypertension Father    Diabetes Father    Heart disease Maternal Grandmother     Social History   Tobacco Use   Smoking status: Never   Smokeless tobacco: Never  Substance Use Topics   Alcohol use: No    Alcohol/week: 0.0 standard drinks of alcohol     Current Outpatient Medications:    escitalopram (LEXAPRO) 10 MG tablet, TAKE 1 TABLET(10 MG) BY MOUTH DAILY, Disp: 30 tablet, Rfl: 0   loratadine (CLARITIN) 10 MG tablet, Take 1 tablet (10 mg total) by mouth daily., Disp: 90 tablet, Rfl: 1   VRAYLAR 1.5 MG capsule, TAKE 1 CAPSULE( 1.5 MG TOTAL) BY MOUTH DAILY., Disp: 90 capsule, Rfl: 0   clotrimazole-betamethasone (LOTRISONE) cream, Apply 1 Application topically 2 (two) times daily as needed (rash, itch burning). Affected area for up to 2 weeks (Patient not taking: Reported on 11/19/2023), Disp: 30 g, Rfl: 0  No Known Allergies  I personally reviewed  active problem list, medication list, allergies with the patient/caregiver today.   ROS  Ten systems reviewed and is negative except as mentioned in HPI    Objective  Vitals:   11/19/23 1427  BP: 108/72  Pulse: 84  Resp: 16  Temp: 98.8 F (37.1 C)  TempSrc: Oral  SpO2: 95%  Weight: 204 lb 3.2 oz (92.6 kg)  Height: 6\' 2"  (1.88 m)    Body mass index is 26.22 kg/m.  Physical Exam  Constitutional: Patient  appears well-developed and well-nourished.  No distress.  HEENT: head atraumatic, normocephalic, pupils equal and reactive to light, neck supple Cardiovascular: Normal rate, regular rhythm and normal heart sounds.  No murmur heard. No BLE edema. Pulmonary/Chest: Effort normal and breath sounds normal. No respiratory distress. Abdominal: Soft.  There is no tenderness. Psychiatric: Patient has a normal mood and affect. behavior is normal. Judgment and thought content normal.   Recent Results (from the past 2160 hour(s))  Urine cytology ancillary only     Status: None   Collection Time: 10/21/23  3:06 PM  Result Value Ref Range   Neisseria Gonorrhea Negative    Chlamydia Negative    Comment Normal Reference Ranger Chlamydia - Negative    Comment      Normal Reference Range Neisseria Gonorrhea - Negative  Lipid panel     Status: None   Collection Time: 10/21/23  3:24 PM  Result Value Ref Range   Cholesterol 171 <200 mg/dL   HDL 49 > OR = 40 mg/dL   Triglycerides 657 <846 mg/dL   LDL Cholesterol (Calc) 99 mg/dL (calc)    Comment: Reference range: <100 . Desirable range <100 mg/dL for primary prevention;   <70 mg/dL for patients with CHD or diabetic patients  with > or = 2 CHD risk factors. Marland Kitchen LDL-C is now calculated using the Martin-Hopkins  calculation, which is a validated novel method providing  better accuracy than the Friedewald equation in the  estimation of LDL-C.  Horald Pollen et al. Lenox Ahr. 9629;528(41): 2061-2068  (http://education.[facility].com/faq/FAQ164)    Total CHOL/HDL Ratio 3.5 <5.0 (calc)   Non-HDL Cholesterol (Calc) 122 <130 mg/dL (calc)    Comment: For patients with diabetes plus 1 major ASCVD risk  factor, treating to a non-HDL-C goal of <100 mg/dL  (LDL-C of <32 mg/dL) is considered a therapeutic  option.   CBC with Differential/Platelet     Status: None   Collection Time: 10/21/23  3:24 PM  Result Value Ref Range   WBC 9.3 3.8 - 10.8 Thousand/uL   RBC  5.60 4.20 - 5.80 Million/uL   Hemoglobin 15.6 13.2 - 17.1 g/dL   HCT 44.0 10.2 - 72.5 %   MCV 84.1 80.0 - 100.0 fL   MCH 27.9 27.0 - 33.0 pg   MCHC 33.1 32.0 - 36.0 g/dL    Comment: For adults, a slight decrease in the calculated MCHC value (in the range of 30 to 32 g/dL) is most likely not clinically significant; however, it should be interpreted with caution in correlation with other red cell parameters and the patient's clinical condition.    RDW 13.3 11.0 - 15.0 %   Platelets 194 140 - 400 Thousand/uL   MPV 11.3 7.5 - 12.5 fL   Neutro Abs 6,873 1,500 - 7,800 cells/uL   Absolute Lymphocytes 1,609 850 - 3,900 cells/uL   Absolute Monocytes 651 200 - 950 cells/uL   Eosinophils Absolute 121 15 - 500 cells/uL   Basophils Absolute 47 0 - 200 cells/uL  Neutrophils Relative % 73.9 %   Total Lymphocyte 17.3 %   Monocytes Relative 7.0 %   Eosinophils Relative 1.3 %   Basophils Relative 0.5 %  COMPLETE METABOLIC PANEL WITH GFR     Status: None   Collection Time: 10/21/23  3:24 PM  Result Value Ref Range   Glucose, Bld 88 65 - 99 mg/dL    Comment: .            Fasting reference interval .    BUN 21 7 - 25 mg/dL   Creat 1.91 4.78 - 2.95 mg/dL   eGFR 621 > OR = 60 HY/QMV/7.84O9   BUN/Creatinine Ratio SEE NOTE: 6 - 22 (calc)    Comment:    Not Reported: BUN and Creatinine are within    reference range. .    Sodium 141 135 - 146 mmol/L   Potassium 3.8 3.5 - 5.3 mmol/L   Chloride 105 98 - 110 mmol/L   CO2 27 20 - 32 mmol/L   Calcium 9.8 8.6 - 10.3 mg/dL   Total Protein 7.2 6.1 - 8.1 g/dL   Albumin 4.4 3.6 - 5.1 g/dL   Globulin 2.8 1.9 - 3.7 g/dL (calc)   AG Ratio 1.6 1.0 - 2.5 (calc)   Total Bilirubin 0.8 0.2 - 1.2 mg/dL   Alkaline phosphatase (APISO) 76 36 - 130 U/L   AST 24 10 - 40 U/L   ALT 33 9 - 46 U/L  Hemoglobin A1c     Status: None   Collection Time: 10/21/23  3:24 PM  Result Value Ref Range   Hgb A1c MFr Bld 5.2 <5.7 % of total Hgb    Comment: For the purpose of  screening for the presence of diabetes: . <5.7%       Consistent with the absence of diabetes 5.7-6.4%    Consistent with increased risk for diabetes             (prediabetes) > or =6.5%  Consistent with diabetes . This assay result is consistent with a decreased risk of diabetes. . Currently, no consensus exists regarding use of hemoglobin A1c for diagnosis of diabetes in children. . According to American Diabetes Association (ADA) guidelines, hemoglobin A1c <7.0% represents optimal control in non-pregnant diabetic patients. Different metrics may apply to specific patient populations.  Standards of Medical Care in Diabetes(ADA). .    Mean Plasma Glucose 103 mg/dL   eAG (mmol/L) 5.7 mmol/L  HIV Antibody (routine testing w rflx)     Status: None   Collection Time: 10/21/23  3:24 PM  Result Value Ref Range   HIV 1&2 Ab, 4th Generation NON-REACTIVE NON-REACTIVE    Comment: HIV-1 antigen and HIV-1/HIV-2 antibodies were not detected. There is no laboratory evidence of HIV infection. Marland Kitchen PLEASE NOTE: This information has been disclosed to you from records whose confidentiality may be protected by state law.  If your state requires such protection, then the state law prohibits you from making any further disclosure of the information without the specific written consent of the person to whom it pertains, or as otherwise permitted by law. A general authorization for the release of medical or other information is NOT sufficient for this purpose. . For additional information please refer to http://education.[facility].com/faq/FAQ106 (This link is being provided for informational/ educational purposes only.) . Marland Kitchen The performance of this assay has not been clinically validated in patients less than 67 years old. .   RPR     Status: None   Collection  Time: 10/21/23  3:24 PM  Result Value Ref Range   RPR Ser Ql NON-REACTIVE NON-REACTIVE    Comment: . No laboratory evidence  of syphilis. If recent exposure is suspected, submit a new sample in 2-4 weeks. .   TSH     Status: None   Collection Time: 10/21/23  3:24 PM  Result Value Ref Range   TSH 1.69 0.40 - 4.50 mIU/L     PHQ2/9:    11/19/2023    2:30 PM 10/21/2023    2:47 PM 08/20/2023    2:18 PM 04/23/2023    1:24 PM 03/20/2023   10:37 AM  Depression screen PHQ 2/9  Decreased Interest 1 3 1 3 3   Down, Depressed, Hopeless 1 3 1 3 3   PHQ - 2 Score 2 6 2 6 6   Altered sleeping 0 2 1 0 0  Tired, decreased energy 0 2 1 1  0  Change in appetite 0 1 2 1  0  Feeling bad or failure about yourself  1 3 1 3 3   Trouble concentrating 1 3 2 3  0  Moving slowly or fidgety/restless 0 1 1 2  0  Suicidal thoughts 1 3 2 3 1   PHQ-9 Score 5 21 12 19 10   Difficult doing work/chores Not difficult at all Not difficult at all Somewhat difficult Somewhat difficult     phq 9 is positive   Fall Risk:    11/19/2023    2:27 PM 10/21/2023    2:46 PM 08/20/2023    2:08 PM 04/23/2023    1:17 PM 03/20/2023   10:37 AM  Fall Risk   Falls in the past year? 0 0 0 0 0  Number falls in past yr: 0    0  Injury with Fall? 0    0  Risk for fall due to : No Fall Risks No Fall Risks No Fall Risks No Fall Risks No Fall Risks  Follow up Falls prevention discussed;Education provided;Falls evaluation completed Falls prevention discussed Falls prevention discussed Falls prevention discussed Falls prevention discussed     Functional Status Survey: Is the patient deaf or have difficulty hearing?: No Does the patient have difficulty seeing, even when wearing glasses/contacts?: No Does the patient have difficulty concentrating, remembering, or making decisions?: Yes Does the patient have difficulty walking or climbing stairs?: No Does the patient have difficulty dressing or bathing?: No Does the patient have difficulty doing errands alone such as visiting a doctor's office or shopping?: No    Assessment & Plan  Assessment and Plan    Major  Depressive Disorder, Recurrent, Chronic Improved but not in remission. Currently on Lexapro. Vraylar was prescribed but patient is not taking it consistently due to forgetfulness. Patient has occasional suicidal thoughts but denies any plans or intent. -Encouraged to take Vraylar along with Lexapro in the morning. -Continue Lexapro. -Encouraged to attend upcoming psychiatry appointment on 12/02/2023 for further medication management.  Generalized Anxiety Disorder No specific concerns raised during this visit. -Continue current management.  Irritable Bowel Syndrome No specific concerns raised during this visit. -Continue current management.  Seasonal Allergies Reports occasional symptoms, potentially related to work environment. -Continue Claritin as needed. -Transferred all prescriptions to a pharmacy closer to patient's new residence.  Follow-up in 3 months.

## 2023-11-16 ENCOUNTER — Other Ambulatory Visit: Payer: Self-pay | Admitting: Family Medicine

## 2023-11-16 DIAGNOSIS — F339 Major depressive disorder, recurrent, unspecified: Secondary | ICD-10-CM

## 2023-11-19 ENCOUNTER — Ambulatory Visit (INDEPENDENT_AMBULATORY_CARE_PROVIDER_SITE_OTHER): Payer: 59 | Admitting: Family Medicine

## 2023-11-19 ENCOUNTER — Encounter: Payer: Self-pay | Admitting: Family Medicine

## 2023-11-19 DIAGNOSIS — F339 Major depressive disorder, recurrent, unspecified: Secondary | ICD-10-CM

## 2023-11-19 DIAGNOSIS — J302 Other seasonal allergic rhinitis: Secondary | ICD-10-CM

## 2023-11-19 DIAGNOSIS — F411 Generalized anxiety disorder: Secondary | ICD-10-CM

## 2023-11-19 MED ORDER — ESCITALOPRAM OXALATE 10 MG PO TABS: ORAL_TABLET | ORAL | 0 refills | Status: DC

## 2023-11-19 MED ORDER — LORATADINE 10 MG PO TABS: 10.0000 mg | ORAL_TABLET | Freq: Every day | ORAL | 1 refills | Status: DC

## 2023-11-19 MED ORDER — CARIPRAZINE HCL 1.5 MG PO CAPS: 1.5000 mg | ORAL_CAPSULE | Freq: Every day | ORAL | 0 refills | Status: DC

## 2023-12-02 ENCOUNTER — Ambulatory Visit: Payer: 59 | Admitting: Psychiatry

## 2023-12-19 DIAGNOSIS — S61219D Laceration without foreign body of unspecified finger without damage to nail, subsequent encounter: Secondary | ICD-10-CM | POA: Diagnosis not present

## 2023-12-19 DIAGNOSIS — M24541 Contracture, right hand: Secondary | ICD-10-CM | POA: Diagnosis not present

## 2024-01-13 DIAGNOSIS — M24541 Contracture, right hand: Secondary | ICD-10-CM | POA: Diagnosis not present

## 2024-04-20 ENCOUNTER — Ambulatory Visit: Payer: Self-pay

## 2024-05-19 ENCOUNTER — Encounter: Payer: Self-pay | Admitting: Family Medicine

## 2024-05-19 ENCOUNTER — Other Ambulatory Visit (HOSPITAL_COMMUNITY)
Admission: RE | Admit: 2024-05-19 | Discharge: 2024-05-19 | Disposition: A | Discharge status: Home / Self Care | Source: Ambulatory Visit | Attending: Family Medicine | Admitting: Family Medicine

## 2024-05-19 ENCOUNTER — Ambulatory Visit (INDEPENDENT_AMBULATORY_CARE_PROVIDER_SITE_OTHER): Payer: Self-pay | Admitting: Family Medicine

## 2024-05-19 VITALS — BP 118/74 | HR 95 | Resp 16 | Ht 74.0 in | Wt 202.3 lb

## 2024-05-19 DIAGNOSIS — J302 Other seasonal allergic rhinitis: Secondary | ICD-10-CM

## 2024-05-19 DIAGNOSIS — Z113 Encounter for screening for infections with a predominantly sexual mode of transmission: Secondary | ICD-10-CM

## 2024-05-19 DIAGNOSIS — F1021 Alcohol dependence, in remission: Secondary | ICD-10-CM

## 2024-05-19 DIAGNOSIS — F99 Mental disorder, not otherwise specified: Secondary | ICD-10-CM

## 2024-05-19 DIAGNOSIS — F5105 Insomnia due to other mental disorder: Secondary | ICD-10-CM

## 2024-05-19 DIAGNOSIS — K580 Irritable bowel syndrome with diarrhea: Secondary | ICD-10-CM

## 2024-05-19 DIAGNOSIS — F411 Generalized anxiety disorder: Secondary | ICD-10-CM

## 2024-05-19 DIAGNOSIS — F339 Major depressive disorder, recurrent, unspecified: Secondary | ICD-10-CM

## 2024-05-19 MED ORDER — LORATADINE 10 MG PO TABS: 10.0000 mg | ORAL_TABLET | Freq: Every day | ORAL | 1 refills | Status: AC

## 2024-05-19 MED ORDER — CARIPRAZINE HCL 1.5 MG PO CAPS: 1.5000 mg | ORAL_CAPSULE | Freq: Every day | ORAL | 0 refills | Status: DC

## 2024-05-19 MED ORDER — TRAZODONE HCL 50 MG PO TABS: 25.0000 mg | ORAL_TABLET | Freq: Every evening | ORAL | 2 refills | Status: DC | PRN

## 2024-05-19 MED ORDER — ESCITALOPRAM OXALATE 10 MG PO TABS: ORAL_TABLET | ORAL | 0 refills | Status: DC
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
## 2024-05-20 ENCOUNTER — Other Ambulatory Visit (HOSPITAL_COMMUNITY): Payer: Self-pay

## 2024-05-20 ENCOUNTER — Telehealth: Payer: Self-pay

## 2024-05-20 LAB — RPR: RPR Ser Ql: NONREACTIVE

## 2024-05-20 LAB — HIV ANTIBODY (ROUTINE TESTING W REFLEX): HIV 1&2 Ab, 4th Generation: NONREACTIVE

## 2024-05-20 NOTE — Telephone Encounter (Signed)
 Pharmacy Patient Advocate Encounter  Received notification from [facility] that Prior Authorization for Vraylar  1.5MG  capsules  has been APPROVED from 05/20/24 to 05/20/25. Ran test claim, Copay is $0. This test claim was processed through [facility]- copay amounts may vary at other pharmacies due to pharmacy/plan contracts, or as the patient moves through the different stages of their insurance plan.   PA #/Case ID/Reference #: [id number]

## 2024-05-20 NOTE — Telephone Encounter (Signed)
 Pharmacy Patient Advocate Encounter   Received notification from Onbase that prior authorization for Vraylar  1.5MG  capsules  is required/requested.   Insurance verification completed.   The patient is insured through [facility] .   Per test claim: PA required; PA submitted to above mentioned insurance via CoverMyMeds Key/confirmation #/EOC [id number] Status is pending

## 2024-05-21 ENCOUNTER — Ambulatory Visit: Payer: Self-pay | Admitting: Family Medicine

## 2024-05-21 LAB — URINE CYTOLOGY ANCILLARY ONLY
Chlamydia: NEGATIVE
Comment: NEGATIVE
Comment: NORMAL
Neisseria Gonorrhea: NEGATIVE

## 2024-07-13 ENCOUNTER — Telehealth: Payer: Self-pay | Admitting: Family Medicine

## 2024-07-13 NOTE — Telephone Encounter (Unsigned)
 Copied from CRM [id number]. Topic: Clinical - Red Word Triage >> Jul 13, 2024  3:44 PM Montie POUR wrote: Red Word that prompted transfer to Nurse Triage:  His depression has gotten worse and refuse to let me transfer to Nurse Triage. Please call patient back at [phone]. Thanks

## 2024-07-14 NOTE — Telephone Encounter (Signed)
 Left detailed vm to call back to schedule.

## 2024-09-15 ENCOUNTER — Ambulatory Visit: Admitting: Family Medicine

## 2024-10-22 NOTE — Patient Instructions (Incomplete)
 Preventive Care 11-23 Years Old, Male Preventive care refers to lifestyle choices and visits with your health care provider that can promote health and wellness. Preventive care visits are also called wellness exams. What can I expect for my preventive care visit? Counseling During your preventive care visit, your health care provider may ask about your: Medical history, including: Past medical problems. Family medical history. Current health, including: Emotional well-being. Home life and relationship well-being. Sexual activity. Lifestyle, including: Alcohol, nicotine or tobacco, and drug use. Access to firearms. Diet, exercise, and sleep habits. Safety issues such as seatbelt and bike helmet use. Sunscreen use. Work and work Astronomer. Physical exam Your health care provider may check your: Height and weight. These may be used to calculate your BMI (body mass index). BMI is a measurement that tells if you are at a healthy weight. Waist circumference. This measures the distance around your waistline. This measurement also tells if you are at a healthy weight and may help predict your risk of certain diseases, such as type 2 diabetes and high blood pressure. Heart rate and blood pressure. Body temperature. Skin for abnormal spots. What immunizations do I need?  Vaccines are usually given at various ages, according to a schedule. Your health care provider will recommend vaccines for you based on your age, medical history, and lifestyle or other factors, such as travel or where you work. What tests do I need? Screening Your health care provider may recommend screening tests for certain conditions. This may include: Lipid and cholesterol levels. Diabetes screening. This is done by checking your blood sugar (glucose) after you have not eaten for a while (fasting). Hepatitis B test. Hepatitis C test. HIV (human immunodeficiency virus) test. STI (sexually transmitted infection)  testing, if you are at risk. Talk with your health care provider about your test results, treatment options, and if necessary, the need for more tests. Follow these instructions at home: Eating and drinking  Eat a healthy diet that includes fresh fruits and vegetables, whole grains, lean protein, and low-fat dairy products. Drink enough fluid to keep your urine pale yellow. Take vitamin and mineral supplements as recommended by your health care provider. Do not drink alcohol if your health care provider tells you not to drink. If you drink alcohol: Limit how much you have to 0-2 drinks a day. Know how much alcohol is in your drink. In the [place] one drink equals one 12 oz bottle of beer (355 mL), one 5 oz glass of wine (148 mL), or one 1 oz glass of hard liquor (44 mL). Lifestyle Brush your teeth every morning and night with fluoride toothpaste. Floss one time each day. Exercise for at least 30 minutes 5 or more days each week. Do not use any products that contain nicotine or tobacco. These products include cigarettes, chewing tobacco, and vaping devices, such as e-cigarettes. If you need help quitting, ask your health care provider. Do not use drugs. If you are sexually active, practice safe sex. Use a condom or other form of protection to prevent STIs. Find healthy ways to manage stress, such as: Meditation, yoga, or listening to music. Journaling. Talking to a trusted person. Spending time with friends and family. Minimize exposure to UV radiation to reduce your risk of skin cancer. Safety Always wear your seat belt while driving or riding in a vehicle. Do not drive: If you have been drinking alcohol. Do not ride with someone who has been drinking. If you have been using any mind-altering substances  or drugs. While texting. When you are tired or distracted. Wear a helmet and other protective equipment during sports activities. If you have firearms in your house, make sure you  follow all gun safety procedures. Seek help if you have been physically or sexually abused. What's next? Go to your health care provider once a year for an annual wellness visit. Ask your health care provider how often you should have your eyes and teeth checked. Stay up to date on all vaccines. This information is not intended to replace advice given to you by your health care provider. Make sure you discuss any questions you have with your health care provider. Document Revised: 05/30/2021 Document Reviewed: 05/30/2021 Elsevier Patient Education  2024 [employer].

## 2024-10-25 ENCOUNTER — Encounter: Payer: Self-pay | Admitting: Family Medicine

## 2024-12-28 ENCOUNTER — Other Ambulatory Visit: Payer: Self-pay

## 2024-12-28 DIAGNOSIS — F339 Major depressive disorder, recurrent, unspecified: Secondary | ICD-10-CM

## 2024-12-28 DIAGNOSIS — F411 Generalized anxiety disorder: Secondary | ICD-10-CM

## 2024-12-28 NOTE — Telephone Encounter (Signed)
 Pt disconnected upon transfer to NT. Attempted to reach patient, left message.   Copied from CRM [id number]. Topic: Clinical - Red Word Triage >> Dec 28, 2024  3:18 PM Tiffini S wrote: Red Word that prompted transfer to Nurse Triage: Started 11/18/24 to present- medicatrion for escitalopram  (LEXAPRO ) 10 MG tablet is not helping- the patient have been having Anxiety attacks daily and feeling depressed/ suicidal

## 2024-12-28 NOTE — Telephone Encounter (Signed)
 Patient/caller refused triage.  Reason for refusal: Only wants to make appt PCP.    Has increasing anxiety. Unable to make next available appt. Wants to be worked in.  Also request new referral for psychiatrist and refill of claritin .   Clinical - Red Word Triage >> Dec 28, 2024  3:18 PM Tiffini S wrote: Red Word that prompted transfer to Nurse Triage: Started 11/18/24 to present- medicatrion for escitalopram  (LEXAPRO ) 10 MG tablet is not helping- the patient have been having Anxiety attacks daily and feeling depressed/ suicidal

## 2025-01-03 ENCOUNTER — Encounter: Payer: Self-pay | Admitting: Family Medicine

## 2025-01-03 ENCOUNTER — Other Ambulatory Visit (HOSPITAL_COMMUNITY)
Admission: RE | Admit: 2025-01-03 | Discharge: 2025-01-03 | Disposition: A | Discharge status: Home / Self Care | Payer: Self-pay | Source: Ambulatory Visit | Attending: Family Medicine | Admitting: Family Medicine

## 2025-01-03 ENCOUNTER — Ambulatory Visit: Payer: Self-pay | Admitting: Family Medicine

## 2025-01-03 VITALS — BP 122/74 | HR 63 | Resp 16 | Ht 74.0 in | Wt 209.0 lb

## 2025-01-03 DIAGNOSIS — F99 Mental disorder, not otherwise specified: Secondary | ICD-10-CM

## 2025-01-03 DIAGNOSIS — G43009 Migraine without aura, not intractable, without status migrainosus: Secondary | ICD-10-CM

## 2025-01-03 DIAGNOSIS — Z113 Encounter for screening for infections with a predominantly sexual mode of transmission: Secondary | ICD-10-CM

## 2025-01-03 DIAGNOSIS — F339 Major depressive disorder, recurrent, unspecified: Secondary | ICD-10-CM

## 2025-01-03 DIAGNOSIS — Z87898 Personal history of other specified conditions: Secondary | ICD-10-CM

## 2025-01-03 DIAGNOSIS — F5105 Insomnia due to other mental disorder: Secondary | ICD-10-CM

## 2025-01-03 DIAGNOSIS — F411 Generalized anxiety disorder: Secondary | ICD-10-CM

## 2025-01-03 MED ORDER — ESCITALOPRAM OXALATE 10 MG PO TABS: ORAL_TABLET | ORAL | 0 refills | Status: DC

## 2025-01-03 MED ORDER — HYDROXYZINE HCL 10 MG PO TABS: 10.0000 mg | ORAL_TABLET | Freq: Every evening | ORAL | 0 refills | Status: DC | PRN

## 2025-01-03 MED ORDER — BUSPIRONE HCL 5 MG PO TABS: 5.0000 mg | ORAL_TABLET | Freq: Three times a day (TID) | ORAL | 2 refills | Status: DC | PRN

## 2025-01-03 MED ORDER — NURTEC 75 MG PO TBDP: 1.0000 | ORAL_TABLET | ORAL | 2 refills | Status: AC

## 2025-01-03 MED ORDER — NURTEC 75 MG PO TBDP: 1.0000 | ORAL_TABLET | ORAL | 2 refills | Status: DC

## 2025-01-03 NOTE — Progress Notes (Unsigned)
 Name: Raymond Yang   MRN: [id number]    DOB: 2001/05/20   Date:01/03/2025       Progress Note  Subjective  Chief Complaint  Chief Complaint  Patient presents with   Medical Management of Chronic Issues    Medications currently taking for dep and anxiety not helping   Discussed the use of AI scribe software for clinical note transcription with the patient, who gave verbal consent to proceed.  History of Present Illness Raymond Yang is a 24 year old male who presents for follow-up of depression and anxiety.  He lives in [place] and works as a environmental consultant. Despite having a stable job, he experiences significant anxiety. He was taking Lexapro  10 mg and Vraylar  1.5 mg but began experiencing early morning vomiting and IBS symptoms, which he suspected might be side effects. After consulting a pharmacist without receiving a definitive answer, he decided to stop Vraylar . He continues to experience episodes of waking up anxious and vomiting, impacting his ability to arrive at work on time. He also has sleep disturbances and was taking trazodone , though he does not recall the last time he took it.  He has noticed a recurrence of migraine headaches without aura, occurring once a month or every other month, lasting up to a week, and associated with allodynia. Due to a personal history of seizures as a child, he wishes to consult a neurologist. He is not currently taking any medication for migraines.  He wants to be screened for STIs. No suicidal planning.    Patient Active Problem List   Diagnosis Date Noted   Irritable bowel syndrome with diarrhea 12/20/2022   GAD (generalized anxiety disorder) 12/20/2022   Major depression, recurrent, chronic 11/15/2022   Marijuana use 08/07/2017   GERD (gastroesophageal reflux disease) 06/20/2017   ADD (attention deficit disorder) 05/26/2015   Insomnia, persistent 05/26/2015   Seasonal allergic rhinitis 05/26/2015   History of seizure  05/26/2015    Past Surgical History:  Procedure Laterality Date   ORIF WRIST FRACTURE Right 09/30/2018   Procedure: OPEN REDUCTION INTERNAL FIXATION (ORIF) RIGHT SCAPHOID FRACTURE;  Surgeon: Edie Norleen PARAS, MD;  Location: [facility];  Service: Orthopedics;  Laterality: Right;  ZIMMER HERBERT SCREW SYSTEM  FLUROSCAN   TYMPANOSTOMY TUBE PLACEMENT     as infant    Family History  Problem Relation Age of Onset   Migraines Mother    Anxiety disorder Mother    Obesity Mother    Sleep apnea Father    Hypertension Father    Diabetes Father    Heart disease Maternal Grandmother     Social History   Tobacco Use   Smoking status: Never   Smokeless tobacco: Never  Substance Use Topics   Alcohol use: No    Alcohol/week: 0.0 standard drinks of alcohol    Current Medications[1]  Allergies[2]  I personally reviewed active problem list, medication list, allergies, family history with the patient/caregiver today.   ROS  Ten systems reviewed and is negative except as mentioned in HPI    Objective Physical Exam CONSTITUTIONAL: Patient appears well-developed and well-nourished.  No distress. HEENT: Head atraumatic, normocephalic, neck supple. CARDIOVASCULAR: Normal rate, regular rhythm and normal heart sounds.  No murmur heard. No BLE edema. PULMONARY: Effort normal and breath sounds normal. No respiratory distress. ABDOMINAL: There is no tenderness or distention. MUSCULOSKELETAL: Normal gait. Without gross motor or sensory deficit. PSYCHIATRIC: Patient has a normal mood and affect. behavior is normal. Judgment and  thought content normal.  Vitals:   01/03/25 1514  BP: 122/74  Pulse: 63  Resp: 16  SpO2: 97%  Weight: 209 lb (94.8 kg)  Height: 6' 2 (1.88 m)    Body mass index is 26.83 kg/m. `  PHQ2/9:    01/03/2025    3:23 PM 05/19/2024    3:00 PM 11/19/2023    2:30 PM 10/21/2023    2:47 PM 08/20/2023    2:18 PM  Depression screen PHQ 2/9  Decreased Interest 3  3 1 3 1   Down, Depressed, Hopeless 3 3 1 3 1   PHQ - 2 Score 6 6 2 6 2   Altered sleeping 3 3 0 2 1  Tired, decreased energy 2 3 0 2 1  Change in appetite 2 3 0 1 2  Feeling bad or failure about yourself  3 3 1 3 1   Trouble concentrating 3 3 1 3 2   Moving slowly or fidgety/restless 3 0 0 1 1  Suicidal thoughts 3 0 1 3 2   PHQ-9 Score 25 21  5  21  12    Difficult doing work/chores Extremely dIfficult Very difficult Not difficult at all Not difficult at all Somewhat difficult     Data saved with a previous flowsheet row definition    phq 9 is positive  Fall Risk:    01/03/2025    3:12 PM 05/19/2024    2:58 PM 11/19/2023    2:27 PM 10/21/2023    2:46 PM 08/20/2023    2:08 PM  Fall Risk   Falls in the past year? 0 0 0 0 0  Number falls in past yr: 0 0 0    Injury with Fall? 0 0  0     Risk for fall due to : No Fall Risks No Fall Risks No Fall Risks No Fall Risks No Fall Risks  Follow up Falls evaluation completed Falls prevention discussed;Education provided;Falls evaluation completed Falls prevention discussed;Education provided;Falls evaluation completed Falls prevention discussed Falls prevention discussed     Data saved with a previous flowsheet row definition     Assessment & Plan Major depressive disorder, recurrent Recurrent episodes impacting work attendance. Patient iscontinued Vraylar  due to possible side effects and does not want any other medications at this time . . - Continue Lexapro  10 mg daily. - Referred to psychiatrist for further management.  Generalized anxiety disorder Anxiety causing sleep disturbance and work issues. Hydroxyzine  and Buspar  considered for management. Nurtec considered for migraine prevention. - Added Buspar  5 mg up to three times a day for daytime anxiety. - Prescribed hydroxyzine  10 mg, 1-2 tablets at bedtime for anxiety and sleep. - Prescribed Nurtec for migraine prevention, to be taken every other day. - Referred to neurologist for migraine  management. - Ordered comprehensive metabolic panel and CBC.            [1]  Current Outpatient Medications:    loratadine  (CLARITIN ) 10 MG tablet, Take 1 tablet (10 mg total) by mouth daily., Disp: 90 tablet, Rfl: 1   busPIRone  (BUSPAR ) 5 MG tablet, Take 1 tablet (5 mg total) by mouth 3 (three) times daily as needed. For anxiety, Disp: 90 tablet, Rfl: 2   escitalopram  (LEXAPRO ) 10 MG tablet, TAKE 1 TABLET(10 MG) BY MOUTH DAILY, Disp: 90 tablet, Rfl: 0   hydrOXYzine  (ATARAX ) 10 MG tablet, Take 1-2 tablets (10-20 mg total) by mouth at bedtime as needed. Sleep/anxiety, Disp: 60 tablet, Rfl: 0   Rimegepant Sulfate (NURTEC) 75 MG TBDP, Take  1 tablet (75 mg total) by mouth every other day. For migraine, Disp: 16 tablet, Rfl: 2 [2] No Known Allergies

## 2025-01-04 LAB — COMPREHENSIVE METABOLIC PANEL WITH GFR
AG Ratio: 1.7 (calc) (ref 1.0–2.5)
ALT: 11 U/L (ref 9–46)
AST: 12 U/L (ref 10–40)
Albumin: 4.7 g/dL (ref 3.6–5.1)
Alkaline phosphatase (APISO): 74 U/L (ref 36–130)
BUN: 14 mg/dL (ref 7–25)
CO2: 32 mmol/L (ref 20–32)
Calcium: 9.4 mg/dL (ref 8.6–10.3)
Chloride: 104 mmol/L (ref 98–110)
Creat: 0.81 mg/dL (ref 0.60–1.24)
Globulin: 2.7 g/dL (ref 1.9–3.7)
Glucose, Bld: 80 mg/dL (ref 65–99)
Potassium: 3.6 mmol/L (ref 3.5–5.3)
Sodium: 142 mmol/L (ref 135–146)
Total Bilirubin: 0.9 mg/dL (ref 0.2–1.2)
Total Protein: 7.4 g/dL (ref 6.1–8.1)
eGFR: 127 mL/min/1.73m2

## 2025-01-04 LAB — CBC WITH DIFFERENTIAL/PLATELET
Absolute Lymphocytes: 1600 {cells}/uL (ref 850–3900)
Absolute Monocytes: 577 {cells}/uL (ref 200–950)
Basophils Absolute: 47 {cells}/uL (ref 0–200)
Basophils Relative: 0.5 %
Eosinophils Absolute: 167 {cells}/uL (ref 15–500)
Eosinophils Relative: 1.8 %
HCT: 46.5 % (ref 39.4–51.1)
Hemoglobin: 15.4 g/dL (ref 13.2–17.1)
MCH: 27.6 pg (ref 27.0–33.0)
MCHC: 33.1 g/dL (ref 31.6–35.4)
MCV: 83.5 fL (ref 81.4–101.7)
MPV: 11 fL (ref 7.5–12.5)
Monocytes Relative: 6.2 %
Neutro Abs: 6910 {cells}/uL (ref 1500–7800)
Neutrophils Relative %: 74.3 %
Platelets: 205 Thousand/uL (ref 140–400)
RBC: 5.57 Million/uL (ref 4.20–5.80)
RDW: 12.6 % (ref 11.0–15.0)
Total Lymphocyte: 17.2 %
WBC: 9.3 Thousand/uL (ref 3.8–10.8)

## 2025-01-04 LAB — HIV ANTIBODY (ROUTINE TESTING W REFLEX)
HIV 1&2 Ab, 4th Generation: NONREACTIVE
HIV FINAL INTERPRETATION: NEGATIVE

## 2025-01-04 LAB — SYPHILIS: RPR W/REFLEX TO RPR TITER AND TREPONEMAL ANTIBODIES, TRADITIONAL SCREENING AND DIAGNOSIS ALGORITHM: RPR Ser Ql: NONREACTIVE

## 2025-01-05 ENCOUNTER — Ambulatory Visit: Payer: Self-pay | Admitting: Family Medicine

## 2025-01-05 LAB — URINE CYTOLOGY ANCILLARY ONLY
Chlamydia: NEGATIVE
Comment: NEGATIVE
Comment: NORMAL
Neisseria Gonorrhea: NEGATIVE

## 2025-01-18 ENCOUNTER — Ambulatory Visit (HOSPITAL_COMMUNITY): Admitting: Registered Nurse

## 2025-01-18 ENCOUNTER — Encounter (HOSPITAL_COMMUNITY): Payer: Self-pay | Admitting: Registered Nurse

## 2025-01-18 DIAGNOSIS — G4700 Insomnia, unspecified: Secondary | ICD-10-CM

## 2025-01-18 DIAGNOSIS — F411 Generalized anxiety disorder: Secondary | ICD-10-CM

## 2025-01-18 DIAGNOSIS — F332 Major depressive disorder, recurrent severe without psychotic features: Secondary | ICD-10-CM

## 2025-01-18 MED ORDER — ESCITALOPRAM OXALATE 20 MG PO TABS: ORAL_TABLET | ORAL | 1 refills | Status: AC

## 2025-01-18 MED ORDER — BUSPIRONE HCL 5 MG PO TABS: 5.0000 mg | ORAL_TABLET | Freq: Two times a day (BID) | ORAL | 1 refills | Status: AC

## 2025-01-18 MED ORDER — HYDROXYZINE HCL 10 MG PO TABS: 10.0000 mg | ORAL_TABLET | Freq: Three times a day (TID) | ORAL | 0 refills | Status: AC | PRN

## 2025-01-18 NOTE — Patient Instructions (Addendum)
 If no one has contacted, you by the end of business day today please call the appropriate office listed below to schedule your next visit for medication management with Luisa Ruder, NP:    [facility] [address] [place], [place], [place], [place] [address]  Phone: [phone] (Call to schedule appointment)  [facility] [address] [place], [place] [address] Phone: [phone] (Call to schedule appointment)   If you are can't afford to get your medications by next week, you need to go to   You will need to go to [facility] to establish outpatient psychiatric services.  Information on [facility] listed below.      Address: [address] [place], [place] [address] Hours: Outpatient: Mon-Fri 8AM to 5PM [facility]) Hours: 24/7 Phone: [phone] Fax: [phone]  Services: Adult Services Advanced Access Walk-In Assessments - All Disabilities If you're a walk-in patient there is generally a wait time involved on a first come, first served basis otherwise contact us  beforehand to setup an appointment. If you're in crisis please use our 24-Hour Crisis Hotline for immediate access to a clinician. If this is your first time, please bring the following with you:   Insurance/Medicaid Card  ID or Social Security Card Any referral documentation Routine Outpatient Therapy Psychiatry/Med Management Substance Abuse Intensive Outpatient (SAIOP) Medication Assisted Treatment - MAT (Suboxone) Tailored Care Management (TCM) Youth Services Advanced Access Walk-In Assessments - All Disabilities Routine Outpatient Therapy Psychiatry/Med Management Tailored Care Management (TCM)     [place]: Behavioral Evaluation & Response Team    BEAR Team: Here to Help This special team helps people in [place] who are facing a mental health or  substance use crisis. We work closely with local partners to make sure everyone gets the follow-up care they need. The BEAR Team responds 24/7 to mental health and substance use emergencies in [place]. Fast response -- usually within 30 minutes 1 Director + 6 Crisis Counselors Works with local partners for follow-up care If you call 911 for a mental health concern, ask for the [employer].       [facility]:    Crisis Intervention Team -CIT Crisis Intervention Team (CIT) is a doctor, hospital between first responders, the mental health system, and consumers and families working towards better outcomes for mental health persons in crisis; including those with substance use disorders and those with intellectual and developmental disabilities.  CIT Is A Local Collaborative Program Local partners include mental health clinicians in private practice, peer support specialists -- among many other partners in our community. If your agency or company is not included in the below as a local partner, please contact us .    Mental Health Crisis Card Mental Health Card is a resource that you can cut out and carry with you everywhere you go      If you experience suicidal or homicidal thoughts, hallucinations, or a severe decline in your mental health, seek immediate help. You can: Call 911 Call [address] [facility] Suicide Prevention Lifeline) ([phone]. This is a hotline for Spanish speakers. Use mobile crisis services Go to the nearest emergency room Veterans can also: Call 988 and press 1 Text [phone] Mental health includes understanding and managing your emotions and behaviors in healthy ways. If you notice signs of emotional or mental distress, reach out to trusted supports--family, friends, healthcare providers, or mental health professionals. Healthy mental habits  include stress-management skills, calming strategies, regular exercise, good sleep, healthy eating, and  maintaining supportive relationships. This information does not replace advice from your healthcare provider--discuss any questions or concerns directly with them.  Mobile Crisis Response Teams Listed by counties in vicinity of [facility] providers [facility] [phone] [facility] [phone] [facility] [phone] [facility] [phone] [place]                * [facility] [phone]                * [employer] [phone]  [facility] [phone] [facility] [employer]  [phone] * [employer] [phone]   Call 988 (National Suicide Prevention Lifeline)

## 2025-01-19 ENCOUNTER — Telehealth (HOSPITAL_COMMUNITY): Payer: Self-pay | Admitting: Registered Nurse

## 2025-01-19 NOTE — Telephone Encounter (Signed)
 Called patient X 3 to schedule 1 month follow up medication management appointment and therapy appointment. Patient answered each time but apparently could not hear me. Confirmed the issue was not with clinic phone. Sending MyChart message with request to call to schedule.
